# Patient Record
Sex: Female | Born: 1960
Health system: Southern US, Community
[De-identification: ages and names within clinical notes are randomized; demographics above are authoritative.]

## PROBLEM LIST (undated history)

## (undated) DIAGNOSIS — D649 Anemia, unspecified: Secondary | ICD-10-CM

## (undated) DIAGNOSIS — M1990 Unspecified osteoarthritis, unspecified site: Secondary | ICD-10-CM

## (undated) DIAGNOSIS — L0292 Furuncle, unspecified: Secondary | ICD-10-CM

## (undated) DIAGNOSIS — Z8619 Personal history of other infectious and parasitic diseases: Secondary | ICD-10-CM

## (undated) DIAGNOSIS — L0293 Carbuncle, unspecified: Secondary | ICD-10-CM

## (undated) DIAGNOSIS — I10 Essential (primary) hypertension: Secondary | ICD-10-CM

## (undated) HISTORY — PX: POLYPECTOMY: SHX149

## (undated) HISTORY — DX: Personal history of other infectious and parasitic diseases: Z86.19

## (undated) HISTORY — DX: Furuncle, unspecified: L02.92

## (undated) HISTORY — DX: Essential (primary) hypertension: I10

## (undated) HISTORY — PX: ABDOMINAL HYSTERECTOMY: SHX81

## (undated) HISTORY — DX: Unspecified osteoarthritis, unspecified site: M19.90

## (undated) HISTORY — DX: Anemia, unspecified: D64.9

## (undated) HISTORY — DX: Carbuncle, unspecified: L02.93

---

## 1998-03-23 ENCOUNTER — Other Ambulatory Visit: Admission: RE | Admit: 1998-03-23 | Discharge: 1998-03-23 | Payer: Self-pay | Admitting: Gynecology

## 2003-03-08 ENCOUNTER — Other Ambulatory Visit: Admission: RE | Admit: 2003-03-08 | Discharge: 2003-03-08 | Payer: Self-pay | Admitting: Obstetrics and Gynecology

## 2004-12-05 ENCOUNTER — Encounter: Admission: RE | Admit: 2004-12-05 | Discharge: 2004-12-05 | Payer: Self-pay | Admitting: Occupational Medicine

## 2008-11-05 ENCOUNTER — Emergency Department (HOSPITAL_COMMUNITY): Admission: EM | Admit: 2008-11-05 | Discharge: 2008-11-05 | Payer: Self-pay | Admitting: Emergency Medicine

## 2009-05-05 ENCOUNTER — Ambulatory Visit: Payer: Self-pay | Admitting: Internal Medicine

## 2009-05-05 DIAGNOSIS — L0293 Carbuncle, unspecified: Secondary | ICD-10-CM

## 2009-05-05 DIAGNOSIS — D509 Iron deficiency anemia, unspecified: Secondary | ICD-10-CM | POA: Insufficient documentation

## 2009-05-05 DIAGNOSIS — E569 Vitamin deficiency, unspecified: Secondary | ICD-10-CM | POA: Insufficient documentation

## 2009-05-05 DIAGNOSIS — L0292 Furuncle, unspecified: Secondary | ICD-10-CM | POA: Insufficient documentation

## 2009-06-14 ENCOUNTER — Ambulatory Visit: Payer: Self-pay | Admitting: Internal Medicine

## 2009-06-15 ENCOUNTER — Encounter: Payer: Self-pay | Admitting: Internal Medicine

## 2009-06-15 LAB — HM MAMMOGRAPHY: HM Mammogram: NORMAL

## 2010-03-02 ENCOUNTER — Ambulatory Visit: Payer: Self-pay | Admitting: Internal Medicine

## 2010-04-23 ENCOUNTER — Ambulatory Visit: Payer: Self-pay | Admitting: Internal Medicine

## 2010-04-23 LAB — CONVERTED CEMR LAB
ALT: 14 units/L (ref 0–35)
AST: 21 units/L (ref 0–37)
Albumin: 4 g/dL (ref 3.5–5.2)
BUN: 12 mg/dL (ref 6–23)
Basophils Absolute: 0 10*3/uL (ref 0.0–0.1)
Basophils Relative: 0.5 % (ref 0.0–3.0)
Bilirubin Urine: NEGATIVE
Bilirubin, Direct: 0.1 mg/dL (ref 0.0–0.3)
CO2: 30 meq/L (ref 19–32)
Calcium: 9.3 mg/dL (ref 8.4–10.5)
Cholesterol: 173 mg/dL (ref 0–200)
Creatinine, Ser: 0.9 mg/dL (ref 0.4–1.2)
Eosinophils Absolute: 0.1 10*3/uL (ref 0.0–0.7)
Eosinophils Relative: 1.3 % (ref 0.0–5.0)
Glucose, Bld: 86 mg/dL (ref 70–99)
HCT: 39.8 % (ref 36.0–46.0)
HDL: 76.9 mg/dL (ref >39.00)
Hemoglobin: 13.6 g/dL (ref 12.0–15.0)
Ketones, ur: NEGATIVE mg/dL
LDL Cholesterol: 86 mg/dL (ref 0–99)
Leukocytes, UA: NEGATIVE
Lymphocytes Relative: 45.5 % (ref 12.0–46.0)
Lymphs Abs: 3.3 10*3/uL (ref 0.7–4.0)
MCHC: 34.2 g/dL (ref 30.0–36.0)
MCV: 90 fL (ref 78.0–100.0)
Monocytes Absolute: 0.6 10*3/uL (ref 0.1–1.0)
Monocytes Relative: 8.5 % (ref 3.0–12.0)
Neutro Abs: 3.2 10*3/uL (ref 1.4–7.7)
Neutrophils Relative %: 44.2 % (ref 43.0–77.0)
Platelets: 206 10*3/uL (ref 150.0–400.0)
Potassium: 4.2 meq/L (ref 3.5–5.1)
RBC: 4.43 M/uL (ref 3.87–5.11)
RDW: 15 % — ABNORMAL HIGH (ref 11.5–14.6)
Specific Gravity, Urine: >=1.03 (ref 1.000–1.030)
TSH: 4.7 microintl units/mL (ref 0.35–5.50)
Total Bilirubin: 0.4 mg/dL (ref 0.3–1.2)
Total CHOL/HDL Ratio: 2
Total Protein, Urine: NEGATIVE mg/dL
Total Protein: 6.9 g/dL (ref 6.0–8.3)
Triglycerides: 52 mg/dL (ref 0.0–149.0)
Urine Glucose: NEGATIVE mg/dL
Urobilinogen, UA: 0.2 (ref 0.0–1.0)
VLDL: 10.4 mg/dL (ref 0.0–40.0)
WBC: 7.2 10*3/uL (ref 4.5–10.5)

## 2010-04-26 ENCOUNTER — Ambulatory Visit: Payer: Self-pay | Admitting: Internal Medicine

## 2010-04-26 DIAGNOSIS — R42 Dizziness and giddiness: Secondary | ICD-10-CM | POA: Insufficient documentation

## 2010-04-26 DIAGNOSIS — M79609 Pain in unspecified limb: Secondary | ICD-10-CM | POA: Insufficient documentation

## 2010-05-11 ENCOUNTER — Encounter: Payer: Self-pay | Admitting: Internal Medicine

## 2010-05-15 ENCOUNTER — Ambulatory Visit: Payer: Self-pay | Admitting: Cardiology

## 2010-05-15 ENCOUNTER — Encounter: Payer: Self-pay | Admitting: Internal Medicine

## 2010-05-15 ENCOUNTER — Ambulatory Visit: Payer: Self-pay

## 2010-05-15 ENCOUNTER — Ambulatory Visit (HOSPITAL_COMMUNITY): Admission: RE | Admit: 2010-05-15 | Discharge: 2010-05-15 | Payer: Self-pay | Admitting: Internal Medicine

## 2011-01-13 LAB — CONVERTED CEMR LAB
ALT: 11 units/L (ref 0–35)
AST: 22 units/L (ref 0–37)
Albumin: 4.3 g/dL (ref 3.5–5.2)
Alkaline Phosphatase: 73 units/L (ref 39–117)
BUN: 9 mg/dL (ref 6–23)
Basophils Absolute: 0.1 10*3/uL (ref 0.0–0.1)
Basophils Relative: 1.8 % (ref 0.0–3.0)
Bilirubin Urine: NEGATIVE
Bilirubin, Direct: 0.2 mg/dL (ref 0.0–0.3)
CO2: 31 meq/L (ref 19–32)
Calcium: 9.5 mg/dL (ref 8.4–10.5)
Chloride: 107 meq/L (ref 96–112)
Cholesterol: 183 mg/dL (ref 0–200)
Creatinine, Ser: 0.8 mg/dL (ref 0.4–1.2)
Eosinophils Absolute: 0.1 10*3/uL (ref 0.0–0.7)
Eosinophils Relative: 1.5 % (ref 0.0–5.0)
GFR calc non Af Amer: 98.53 mL/min (ref >60)
Glucose, Bld: 94 mg/dL (ref 70–99)
HCT: 36.2 % (ref 36.0–46.0)
HDL: 76.9 mg/dL (ref >39.00)
Hemoglobin, Urine: NEGATIVE
Hemoglobin: 12.4 g/dL (ref 12.0–15.0)
Iron: 55 ug/dL (ref 42–145)
Ketones, ur: NEGATIVE mg/dL
LDL Cholesterol: 91 mg/dL (ref 0–99)
Leukocytes, UA: NEGATIVE
Lymphocytes Relative: 48.6 % — ABNORMAL HIGH (ref 12.0–46.0)
Lymphs Abs: 4 10*3/uL (ref 0.7–4.0)
MCHC: 34.3 g/dL (ref 30.0–36.0)
MCV: 87.5 fL (ref 78.0–100.0)
Monocytes Absolute: 0.5 10*3/uL (ref 0.1–1.0)
Monocytes Relative: 5.8 % (ref 3.0–12.0)
Neutro Abs: 3.4 10*3/uL (ref 1.4–7.7)
Neutrophils Relative %: 42.3 % — ABNORMAL LOW (ref 43.0–77.0)
Nitrite: NEGATIVE
Pap Smear: NORMAL
Pap Smear: NORMAL
Platelets: 179 10*3/uL (ref 150.0–400.0)
Potassium: 4.4 meq/L (ref 3.5–5.1)
RBC: 4.14 M/uL (ref 3.87–5.11)
RDW: 13.1 % (ref 11.5–14.6)
Saturation Ratios: 13.9 % — ABNORMAL LOW (ref 20.0–50.0)
Sodium: 145 meq/L (ref 135–145)
Specific Gravity, Urine: <=1.005 (ref 1.000–1.030)
TSH: 2.57 microintl units/mL (ref 0.35–5.50)
Total Bilirubin: 0.6 mg/dL (ref 0.3–1.2)
Total CHOL/HDL Ratio: 2
Total Protein, Urine: NEGATIVE mg/dL
Total Protein: 7.1 g/dL (ref 6.0–8.3)
Transferrin: 282.3 mg/dL (ref 212.0–360.0)
Triglycerides: 74 mg/dL (ref 0.0–149.0)
Urine Glucose: NEGATIVE mg/dL
Urobilinogen, UA: 0.2 (ref 0.0–1.0)
VLDL: 14.8 mg/dL (ref 0.0–40.0)
WBC: 8.1 10*3/uL (ref 4.5–10.5)
pH: 6 (ref 5.0–8.0)

## 2011-01-17 NOTE — Miscellaneous (Signed)
Summary: Doctor, [facility]   Imported By: Lester [place] 03/09/2010 10:10:20  _____________________________________________________________________  External Attachment:    Type:   Image     Comment:   External Document

## 2011-01-17 NOTE — Miscellaneous (Signed)
Summary: Orders Update  Clinical Lists Changes  Orders: Added new Test order of Carotid Duplex (Carotid Duplex) - Signed 

## 2011-01-17 NOTE — Assessment & Plan Note (Signed)
Summary: SORE THROAT/FEVER/SIDE DOOR/CD   Vital Signs:  Patient profile:   50 year old female Height:      67 inches Weight:      138.50 pounds BMI:     21.77 O2 Sat:      99 % on Room air Temp:     99.1 degrees F oral Pulse rate:   82 / minute BP sitting:   110 / 80  (left arm) Cuff size:   regular  Vitals Entered ByZella Ball Ewing (March 02, 2010 9:16 AM)  O2 Flow:  Room air CC: sore throat, chest congestion/RE   CC:  sore throat and chest congestion/RE.  History of Present Illness: here with 2 to 3 days acute onset severe ST, and fever, general weakness and malaise and mild headache,;  Pt denies CP, sob, doe, wheezing, orthopnea, pnd, worsening LE edema, palps, dizziness or syncope    Problems Prior to Update: 1)  Pharyngitis-acute  (ICD-462) 2)  Boils, Recurrent  (ICD-680.9) 3)  Preventive Health Care  (ICD-V70.0) 4)  Unspecified Vitamin Deficiency  (ICD-269.2) 5)  Anemia-iron Deficiency  (ICD-280.9)  Medications Prior to Update: 1)  Biotin 1000 Mcg Tabs (Biotin) .Marland Kitchen.. 1 By Mouth Once Daily 2)  Vitamin C 500 Mg Tabs (Ascorbic Acid) .Marland Kitchen.. 1 By Mouth Once Daily 3)  Septra Ds 800-160 Mg Tabs (Sulfamethoxazole-Trimethoprim) .Marland Kitchen.. 1po Two Times A Day 4)  Hydrocodone-Acetaminophen 5-325 Mg Tabs (Hydrocodone-Acetaminophen) .Marland Kitchen.. 1po Q 6 Hrs As Needed  Current Medications (verified): 1)  Biotin 1000 Mcg Tabs (Biotin) .Marland Kitchen.. 1 By Mouth Once Daily 2)  Vitamin C 500 Mg Tabs (Ascorbic Acid) .Marland Kitchen.. 1 By Mouth Once Daily 3)  Azithromycin 250 Mg Tabs (Azithromycin) .... 2po Qd For 1 Day, Then 1po Qd For 4days, Then Stop 4)  Tessalon Perles 100 Mg Caps (Benzonatate) .Marland Kitchen.. 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  , mild ill  Head:  normocephalic and atraumatic.   Eyes:  vision grossly intact, pupils equal, and pupils round.   Ears:  bilat tm's red, sinus nontender Nose:  nasal dischargemucosal pallor and mucosal edema.   Mouth:  pharyngeal erythema, fair dentition, and pharyngeal exudate.   Neck:  supple and cervical lymphadenopathy.   Lungs:  normal respiratory effort and normal breath sounds.   Heart:  normal rate and regular rhythm.   Extremities:  no edema, no erythema    Impression & Recommendations:  Problem # 1:  PHARYNGITIS-ACUTE (ICD-462)  Her updated medication list for this problem includes:    Azithromycin 250 Mg Tabs (Azithromycin) .Marland Kitchen... 2po qd for 1 day, then 1po qd for 4days, then stop treat as above, f/u any worsening signs or symptoms   Complete Medication List: 1)  Biotin 1000 Mcg Tabs (Biotin) .Marland Kitchen.. 1 by mouth once daily 2)  Vitamin C 500 Mg Tabs (Ascorbic acid) .Marland Kitchen.. 1 by mouth once daily 3)  Azithromycin 250 Mg Tabs (Azithromycin) .Marland KitchenMarland KitchenMarland Kitchen  2po qd for 1 day, then 1po qd for 4days, then stop 4)  Tessalon Perles 100 Mg Caps (Benzonatate) .Marland Kitchen.. 1-2 by mouth three times a day as needed cough if needed  Patient Instructions: 1)  Please take all new medications as prescribed 2)  Continue all previous medications as before this visit  3)  You are given the work note today 4)  Please schedule a follow-up appointment in 2 months with CPX labs Prescriptions: TESSALON PERLES 100 MG CAPS (BENZONATATE) 1-2 by mouth three times a day as needed cough if needed  #60 x 1   Entered and Authorized by:   Corwin Levins MD   Signed by:   Corwin Levins MD on 03/02/2010   Method  used:   Print then Give to Patient   RxID:   [id number] AZITHROMYCIN 250 MG TABS (AZITHROMYCIN) 2po qd for 1 day, then 1po qd for 4days, then stop  #6 x 1   Entered and Authorized by:   Corwin Levins MD   Signed by:   Corwin Levins MD on 03/02/2010   Method used:   Print then Give to Patient   RxID:   [id number]

## 2011-01-17 NOTE — Assessment & Plan Note (Signed)
Summary: CPX / NWS #   Vital Signs:  Patient profile:   50 year old female Height:      67 inches Weight:      140.01 pounds BMI:     22.01 Temp:     97.8 degrees F oral Pulse rate:   72 / minute Pulse rhythm:   regular Resp:     16 per minute BP sitting:   130 / 80  (left arm) Cuff size:   regular  Vitals Entered By: Mervin Kung CMA (Apr 26, 2010 1:18 PM)  Preventive Care Screening  Mammogram:    Date:  06/15/2009    Results:  normal   Pap Smear:    Date:  06/15/2009    Results:  normal   CC: Room B1  Pt here for physical. States she is having burning and tingling from her left knee down x 1 month. Is Patient Diabetic? No Comments Pt states she has completed z-pack and tessalon perles.     CC:  Room B1  Pt here for physical. States she is having burning and tingling from her left knee down x 1 month..  History of Present Illness: overall doing well, except for the above, with burning tingling sensation intermitent, mild to area between the knee and ankle for 1 month, with some ? dysethesia to touch, but no leg weakness or numbness. Same discomfort happened last year seemed to resolve in a few days with ibuprofen.   Pt denies CP, sob, doe, wheezing, orthopnea, pnd, worsening LE edema, palps,  or syncope  Pt denies other new neuro symptoms such as headache, facial or extremity weakness No lower back pain.  No bowel or bladder changes.    Preventive Screening-Counseling & Management  Alcohol-Tobacco     Alcohol drinks/day: 3 glasses weekly     Alcohol type: wine     Smoking Status: current     Packs/Day: 0.25  Caffeine-Diet-Exercise     Caffeine use/day: 4 cups daily     Does Patient Exercise: yes     Type of exercise: walking--deliver mail      Drug Use:  no.    Problems Prior to Update: 1)  Leg Pain, Left  (ICD-729.5) 2)  Dizziness  (ICD-780.4) 3)  Boils, Recurrent  (ICD-680.9) 4)  Preventive Health Care  (ICD-V70.0) 5)  Unspecified Vitamin Deficiency   (ICD-269.2) 6)  Anemia-iron Deficiency  (ICD-280.9)  Medications Prior to Update: 1)  Biotin 1000 Mcg Tabs (Biotin) .Marland Kitchen.. 1 By Mouth Once Daily 2)  Vitamin C 500 Mg Tabs (Ascorbic Acid) .Marland Kitchen.. 1 By Mouth Once Daily 3)  Azithromycin 250 Mg Tabs (Azithromycin) .... 2po Qd For 1 Day, Then 1po Qd For 4days, Then Stop 4)  Tessalon Perles 100 Mg Caps (Benzonatate) .Marland Kitchen.. 1-2 By Mouth Three Times A Day As Needed Cough If Needed  Current Medications (verified): 1)  Biotin 1000 Mcg Tabs (Biotin) .Marland Kitchen.. 1 By Mouth Once Daily 2)  Vitamin C 500 Mg Tabs (Ascorbic Acid) .Marland Kitchen.. 1 By Mouth Once Daily 3)  Ibuprofen 400 Mg Tabs (Ibuprofen) .Marland Kitchen.. 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 Alcohol use-yes Drug use-no Packs/Day:  0.25 Caffeine use/day:  4 cups daily Does Patient Exercise:  yes Drug Use:  no  Review of Systems  The patient denies anorexia, fever, weight loss, weight gain, vision loss,  decreased hearing, hoarseness, chest pain, syncope, dyspnea on exertion, peripheral edema, prolonged cough, headaches, hemoptysis, abdominal pain, melena, hematochezia, severe indigestion/heartburn, hematuria, muscle weakness, suspicious skin lesions, transient blindness, difficulty walking, depression, unusual weight change, abnormal bleeding, enlarged lymph nodes, angioedema, and breast masses.         all otherwise negative per pt -  except  also this past sunday has significant dizziness with lightheadness; had to lie down for about an hour and improved;  , no frank syncope, lasted total about 3-4 hours and resolved  Physical Exam  General:  alert and well-developed.   Head:  normocephalic and atraumatic.   Eyes:  vision grossly intact, pupils equal, and pupils round.   Ears:  R ear normal and L ear normal.   Nose:  no external deformity and no nasal discharge.   Mouth:  no gingival abnormalities and pharynx pink and moist.   Neck:  supple and no masses.   Lungs:  normal respiratory effort and normal breath sounds.   Heart:  normal rate and regular rhythm.   Abdomen:  soft, non-tender, and normal bowel sounds.   Msk:  no joint tenderness and no joint swelling.   Extremities:  no edema, no erythema  Neurologic:  cranial nerves II-XII intact and strength normal in all extremities.   Skin:  color normal and no rashes.   Psych:  not anxious appearing and not depressed appearing.     Impression & Recommendations:  Problem # 1:  Preventive Health Care (ICD-V70.0)  Overall doing well, age appropriate education and counseling updated and referral for appropriate preventive services done unless declined, immunizations up to date or declined, diet counseling done if overweight, urged to quit smoking if smokes , most recent labs reviewed and current ordered if appropriate, ecg reviewed or declined (interpretation per ECG scanned in the EMR if done); information regarding Medicare Prevention  requirements given if appropriate; speciality referrals updated as appropriate   Orders: EKG w/ Interpretation (93000)  Problem # 2:  DIZZINESS (ICD-780.4)  unclear etiolgoy; will check echo and doppelrs as she is chronic smoker  Orders: Echo Referral (Echo) Misc. Referral (Misc. Ref)  Problem # 3:  LEG PAIN, LEFT (ICD-729.5) ? neuritic vs other soft tissue  - for ibuprofen as needed , f/u any worsening symtpoms, decliens b12 check today  Complete Medication List: 1)  Biotin 1000 Mcg Tabs (Biotin) .Marland Kitchen.. 1 by mouth once daily 2)  Vitamin C 500 Mg Tabs (Ascorbic acid) .Marland Kitchen.. 1 by mouth once daily 3)  Ibuprofen 400 Mg Tabs (Ibuprofen) .Marland Kitchen.. 1-2 by mouth q 6 hrs as needed pain  Patient Instructions: 1)  You will be contacted about the referral(s) to: echocardiogram and carotid doppler tests to further evaluate the dizziness 2)  Please take all new medications as prescribed 3)  Continue all previous medications as before this visit  4)  Please schedule a follow-up appointment in 1 year or sooner if needed Prescriptions: IBUPROFEN 400 MG TABS (IBUPROFEN) 1-2 by mouth q 6 hrs as needed pain  #60 x 2   Entered and Authorized by:   Corwin Levins MD   Signed by:   Corwin Levins MD on 04/26/2010   Method used:   Print then Give to Patient   RxID:   [id number]   Current Allergies (reviewed today): No known allergies

## 2011-01-17 NOTE — Letter (Signed)
Summary: Out of Work  [facility]  [address] [place], [place] [address]   Phone: [phone]  Fax: [phone]    March 02, 2010   Employee:  Danielle Williams    To Whom It May Concern:   For Medical reasons, please excuse the above named employee from work for the following dates:  Start:   Mar 02, 2010  End:   Mar 04, 2010    -    to return to work Mar 05, 2010 without restriction  If you need additional information, please feel free to contact our office.         Sincerely,    Corwin Levins MD

## 2011-03-19 ENCOUNTER — Ambulatory Visit: Payer: Self-pay | Admitting: Internal Medicine

## 2011-06-27 ENCOUNTER — Encounter: Payer: Self-pay | Admitting: Internal Medicine

## 2011-06-27 DIAGNOSIS — Z0000 Encounter for general adult medical examination without abnormal findings: Secondary | ICD-10-CM | POA: Insufficient documentation

## 2011-06-28 ENCOUNTER — Emergency Department (HOSPITAL_COMMUNITY): Payer: Federal, State, Local not specified - PPO

## 2011-06-28 ENCOUNTER — Encounter: Payer: Self-pay | Admitting: Internal Medicine

## 2011-06-28 ENCOUNTER — Emergency Department (HOSPITAL_COMMUNITY)
Admission: EM | Admit: 2011-06-28 | Discharge: 2011-06-28 | Disposition: A | Discharge status: Home / Self Care | Payer: Federal, State, Local not specified - PPO | Attending: Emergency Medicine | Admitting: Emergency Medicine

## 2011-06-28 ENCOUNTER — Ambulatory Visit (INDEPENDENT_AMBULATORY_CARE_PROVIDER_SITE_OTHER): Payer: Federal, State, Local not specified - PPO | Admitting: Internal Medicine

## 2011-06-28 VITALS — BP 118/88 | HR 72 | Temp 97.9°F | Resp 14 | Wt 133.2 lb

## 2011-06-28 DIAGNOSIS — I6529 Occlusion and stenosis of unspecified carotid artery: Secondary | ICD-10-CM | POA: Insufficient documentation

## 2011-06-28 DIAGNOSIS — L0292 Furuncle, unspecified: Secondary | ICD-10-CM

## 2011-06-28 DIAGNOSIS — R079 Chest pain, unspecified: Secondary | ICD-10-CM | POA: Insufficient documentation

## 2011-06-28 DIAGNOSIS — R0789 Other chest pain: Secondary | ICD-10-CM

## 2011-06-28 DIAGNOSIS — I200 Unstable angina: Secondary | ICD-10-CM | POA: Insufficient documentation

## 2011-06-28 DIAGNOSIS — L0293 Carbuncle, unspecified: Secondary | ICD-10-CM

## 2011-06-28 LAB — COMPREHENSIVE METABOLIC PANEL
ALT: 10 U/L (ref 0–35)
AST: 20 U/L (ref 0–37)
Albumin: 3.7 g/dL (ref 3.5–5.2)
Alkaline Phosphatase: 71 U/L (ref 39–117)
CO2: 27 mEq/L (ref 19–32)
Calcium: 9.3 mg/dL (ref 8.4–10.5)
Chloride: 105 mEq/L (ref 96–112)
Creatinine, Ser: 0.71 mg/dL (ref 0.50–1.10)
GFR calc Af Amer: >60 mL/min (ref >60)
GFR calc non Af Amer: >60 mL/min (ref >60)
Potassium: 3.6 mEq/L (ref 3.5–5.1)
Total Bilirubin: 0.2 mg/dL — ABNORMAL LOW (ref 0.3–1.2)
Total Protein: 6.8 g/dL (ref 6.0–8.3)

## 2011-06-28 LAB — DIFFERENTIAL
Basophils Absolute: 0 10*3/uL (ref 0.0–0.1)
Basophils Relative: 0 % (ref 0–1)
Eosinophils Absolute: 0.1 10*3/uL (ref 0.0–0.7)
Eosinophils Relative: 1 % (ref 0–5)
Lymphocytes Relative: 49 % — ABNORMAL HIGH (ref 12–46)
Lymphs Abs: 3.4 10*3/uL (ref 0.7–4.0)
Monocytes Absolute: 0.3 10*3/uL (ref 0.1–1.0)
Monocytes Relative: 5 % (ref 3–12)
Neutrophils Relative %: 45 % (ref 43–77)

## 2011-06-28 LAB — CBC
HCT: 34.8 % — ABNORMAL LOW (ref 36.0–46.0)
Hemoglobin: 12.1 g/dL (ref 12.0–15.0)
MCH: 29.8 pg (ref 26.0–34.0)
MCHC: 34.8 g/dL (ref 30.0–36.0)
MCV: 85.7 fL (ref 78.0–100.0)
Platelets: 211 10*3/uL (ref 150–400)
RBC: 4.06 MIL/uL (ref 3.87–5.11)
RDW: 13.8 % (ref 11.5–15.5)
WBC: 6.9 10*3/uL (ref 4.0–10.5)

## 2011-06-28 LAB — CK TOTAL AND CKMB (NOT AT ARMC)
CK, MB: 3 ng/mL (ref 0.3–4.0)
Relative Index: 1.2 (ref 0.0–2.5)

## 2011-06-28 LAB — TROPONIN I: Troponin I: <0.3 ng/mL (ref <0.30)

## 2011-06-28 NOTE — Patient Instructions (Signed)
You were given the ASA 81 mg in the office today Continue all other medications as before Please go with EMS to the [facility] ER for further eval and treatment

## 2011-06-28 NOTE — Assessment & Plan Note (Signed)
Currently on doxy course

## 2011-06-28 NOTE — Assessment & Plan Note (Signed)
New for 3 days, will give ASA 81 po x 1 now, call for EMS to transfer to [facility] ER for further eval and tx

## 2011-06-28 NOTE — Assessment & Plan Note (Signed)
Will need f/u doppler when able

## 2011-06-28 NOTE — Progress Notes (Signed)
  Subjective:    Patient ID: Danielle Williams, female    DOB: 1961-01-01, 50 y.o.   MRN: [id number]  HPI  50 yo F, chronic smoker, known carotid atherosclerotic dz, here with 3 days onset intermittent mid and left mid sternal area chest pain, dull with some tingling to left arm, worse 2 days ago, now less so, but having active discomfort now - mild;  nonpleuritic and nonexertional; has intermittent sweats which have not been worse, and not assoc with worsening sob, n/v, other diaphoresis, palp or syncope.   ECG today - sinus , with old IMI  - new from prior ECG  May 2011.   Did not think to try tums but doesn't think it reflux. May 2011 Echo - normal EF, no wall motion abnormalities Past Medical History  Diagnosis Date  . Anemia     Iron def  . History of hepatitis   . Recurrent boils     Recurrent  . Vitamin D deficiency    Past Surgical History  Procedure Date  . Abdominal hysterectomy     Due to fibroids    reports that she has been smoking.  She does not have any smokeless tobacco history on file. She reports that she drinks alcohol. She reports that she uses illicit drugs. family history includes Alcohol abuse in her cousin; Arthritis in her other; Cancer in her cousin and other; Diabetes in her sister; Heart disease in her other; and Hypertension in her sister. No Known Allergies Current Outpatient Prescriptions on File Prior to Visit  Medication Sig Dispense Refill  . Biotin 1000 MCG tablet Take 1,000 mcg by mouth daily.        Marland Kitchen ibuprofen (ADVIL,MOTRIN) 400 MG tablet Take 400 mg by mouth every 6 (six) hours as needed.        . vitamin C (ASCORBIC ACID) 500 MG tablet Take 500 mg by mouth daily.         Review of Systems Review of Systems  Constitutional: Negative for diaphoresis and unexpected weight change.  HENT: Negative for drooling and tinnitus.   Eyes: Negative for photophobia and visual disturbance.  Respiratory: Negative for choking and stridor.   Gastrointestinal:  Negative for vomiting and blood in stool.  Genitourinary: Negative for hematuria and decreased urine volume.  Musculoskeletal: Negative for gait problem.  Skin: Negative for color change and wound.  Neurological: Negative for tremors and numbness.  Psychiatric/Behavioral: Negative for decreased concentration. The patient is not hyperactive.       Objective:   Physical Exam BP 118/88  Pulse 72  Temp(Src) 97.9 F (36.6 C) (Oral)  Resp 14  Wt 133 lb 4 oz (60.442 kg)  SpO2 90% Physical Exam  VS noted Constitutional: Pt appears well-developed and well-nourished.  HENT: Head: Normocephalic.  Right Ear: External ear normal.  Left Ear: External ear normal.  Eyes: Conjunctivae and EOM are normal. Pupils are equal, round, and reactive to light.  Neck: Normal range of motion. Neck supple.  Cardiovascular: Normal rate and regular rhythm.   Pulmonary/Chest: Effort normal and breath sounds normal.  Abd:  Soft, NT, non-distended, + BS Neurological: Pt is alert. No cranial nerve deficit.  Skin: Skin is warm. No erythema.  Psychiatric: Pt behavior is normal. Thought content normal.         Assessment & Plan:

## 2011-07-04 ENCOUNTER — Encounter: Payer: Self-pay | Admitting: Cardiology

## 2011-07-04 ENCOUNTER — Ambulatory Visit (INDEPENDENT_AMBULATORY_CARE_PROVIDER_SITE_OTHER): Payer: Federal, State, Local not specified - PPO | Admitting: Cardiology

## 2011-07-04 ENCOUNTER — Encounter: Payer: Self-pay | Admitting: Internal Medicine

## 2011-07-04 ENCOUNTER — Ambulatory Visit (INDEPENDENT_AMBULATORY_CARE_PROVIDER_SITE_OTHER): Payer: Federal, State, Local not specified - PPO | Admitting: Internal Medicine

## 2011-07-04 VITALS — BP 132/80 | HR 80 | Temp 98.1°F | Ht 67.0 in | Wt 135.5 lb

## 2011-07-04 DIAGNOSIS — L0293 Carbuncle, unspecified: Secondary | ICD-10-CM

## 2011-07-04 DIAGNOSIS — R079 Chest pain, unspecified: Secondary | ICD-10-CM | POA: Insufficient documentation

## 2011-07-04 DIAGNOSIS — Z720 Tobacco use: Secondary | ICD-10-CM | POA: Insufficient documentation

## 2011-07-04 DIAGNOSIS — F17200 Nicotine dependence, unspecified, uncomplicated: Secondary | ICD-10-CM

## 2011-07-04 DIAGNOSIS — Z0000 Encounter for general adult medical examination without abnormal findings: Secondary | ICD-10-CM

## 2011-07-04 DIAGNOSIS — L0292 Furuncle, unspecified: Secondary | ICD-10-CM

## 2011-07-04 DIAGNOSIS — R9431 Abnormal electrocardiogram [ECG] [EKG]: Secondary | ICD-10-CM

## 2011-07-04 DIAGNOSIS — I6529 Occlusion and stenosis of unspecified carotid artery: Secondary | ICD-10-CM

## 2011-07-04 MED ORDER — VARENICLINE TARTRATE 1 MG PO TABS: 1.0000 mg | ORAL_TABLET | Freq: Two times a day (BID) | ORAL | Status: DC

## 2011-07-04 MED ORDER — DOXYCYCLINE HYCLATE 100 MG PO TABS: 100.0000 mg | ORAL_TABLET | Freq: Two times a day (BID) | ORAL | Status: AC

## 2011-07-04 NOTE — Progress Notes (Signed)
HPI The patient presents for evaluation of chest discomfort and an abnormal EKG. She does have a history of carotid stenosis as described below. She had an echocardiogram last year apparently to evaluate an abnormal EKG. I have reviewed this and there were no significant abnormalities. Last week she had some chest discomfort. This was left-sided. There was some discomfort and left arm. It lasted all Wednesday night. It was intermittent over the next couple of days. Followup EKG suggested new inferior Q waves though no acute ST T-wave changes. She subsequently presented to the emergency room where she ruled out for myocardial infarction. A followup EKG did not demonstrate Q waves and suggested that there was possibly a lead placement issue with the previous EKG. Over the past couple of days she has had no recurrent chest discomfort. She's had decreased exercise tolerance however. Her chest discomfort originally was moderate in intensity. There was no associated nausea or vomiting. She did have some mild shortness of breath no PND or orthopnea. 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  No Known Allergies  Current Outpatient Prescriptions  Medication Sig Dispense Refill  . Biotin 1000 MCG tablet Take 1,000 mcg by mouth daily.        Marland Kitchen doxycycline (VIBRA-TABS) 100 MG tablet Take 100 mg by mouth 2 (two) times daily.        Marland Kitchen ibuprofen (ADVIL,MOTRIN) 400 MG tablet Take 400 mg by mouth every 6 (six) hours as needed.        . vitamin C (ASCORBIC ACID) 500 MG tablet Take 500 mg by mouth daily.          Past Medical History  Diagnosis Date  . Anemia     Iron def  . History of hepatitis   . Recurrent boils     Recurrent  . Vitamin D deficiency     Past Surgical History  Procedure Date  . Abdominal hysterectomy     Due to fibroids    Family History  Problem Relation Age of Onset  . Hypertension Sister   . Diabetes Sister     . Arthritis Other     Parent  . Cancer Other     Breast and ovary-sister and cousin  . Heart disease Other     Aunt  . Cancer Cousin     Brain  . Alcohol abuse Cousin     Multiple    History   Social History  . Marital Status: Divorced    Spouse Name: N/A    Number of Children: N/A  . Years of Education: N/A   Occupational History  . Not on file.   Social History Main Topics  . Smoking status: Current Everyday Smoker -- 0.5 packs/day for 30 years    Types: Cigarettes  . Smokeless tobacco: Never Used   Comment: off and on for 38yrs  . Alcohol Use: Yes  . Drug Use: Yes  . Sexually Active: Not on file   Other Topics Concern  . Not on file   Social History Narrative  . No narrative on file    ROS: As stated in the HPI and negative for all other systems.   PHYSICAL EXAM BP 114/84  Pulse 83  Ht 5\' 7"  (1.702 m)  Wt 133 lb (60.328 kg)  BMI 20.83 kg/m2 GENERAL:  Well appearing HEENT:  Pupils equal round and reactive, fundi not visualized, oral mucosa unremarkable,  dentures NECK:  No jugular venous distention, waveform within normal limits, carotid upstroke brisk and symmetric, no bruits, no thyromegaly LYMPHATICS:  No cervical, inguinal adenopathy LUNGS:  Clear to auscultation bilaterally BACK:  No CVA tenderness CHEST:  Unremarkable HEART:  PMI not displaced or sustained,S1 and S2 within normal limits, no S3, no S4, no clicks, no rubs, no murmurs ABD:  Flat, positive bowel sounds normal in frequency in pitch, no bruits, no rebound, no guarding, no midline pulsatile mass, no hepatomegaly, no splenomegaly EXT:  2 plus pulses throughout, no edema, no cyanosis no clubbing SKIN:  No rashes no nodules NEURO:  Cranial nerves II through XII grossly intact, motor grossly intact throughout PSYCH:  Cognitively intact, oriented to person place and time   EKG:  Rhythm, rate 83, leftward axis,, RSR prime V1 and V2, left ventricular hypertrophy inferior T-wave inversion  nonspecific  ASSESSMENT AND PLAN

## 2011-07-04 NOTE — Patient Instructions (Signed)
Take all new medications as prescribed Continue all other medications as before Please keep your appointments with your specialists as you have planned - Dr Antoine Poche, and the tests ordered Please quit smoking, as you plan to do with the chantix You are given the work note today You are given the EKG and Echo results from May 2011 Please return in 6 mo with Lab testing done 3-5 days before

## 2011-07-04 NOTE — Assessment & Plan Note (Signed)
Mild to mod, for antibx course,  to f/u any worsening symptoms or concerns 

## 2011-07-04 NOTE — Assessment & Plan Note (Signed)
I spent greater than 3 minutes discussing the need to stop smoking. She doesn't want a prescription for Chantix. We discussed the black box warning at all to side effects.

## 2011-07-04 NOTE — Assessment & Plan Note (Signed)
Patient chest pain is somewhat atypical. However, she does have some vascular disease documented and risk factors. I will check an echocardiogram to make sure there is no new wall motion abnormalities. I will then plan an exercise treadmill test. She needs aggressive risk reduction.

## 2011-07-04 NOTE — Assessment & Plan Note (Signed)
She will have a followup carotid Doppler.

## 2011-07-04 NOTE — Patient Instructions (Addendum)
Your physician has requested that you have a carotid duplex. This test is an ultrasound of the carotid arteries in your neck. It looks at blood flow through these arteries that supply the brain with blood. Allow one hour for this exam. There are no restrictions or special instructions.  Your physician has requested that you have an echocardiogram. Echocardiography is a painless test that uses sound waves to create images of your heart. It provides your doctor with information about the size and shape of your heart and how well your heart's chambers and valves are working. This procedure takes approximately one hour. There are no restrictions for this procedure.  You are being scheduled for a treadmill.  Please follow the instructions given.  Your physician discussed the hazards of tobacco use. Tobacco use cessation is recommended and techniques and options to help you quit were discussed.  Please continue your current medications as listed.  You may start Chantix as ordered.  A prescription has been sent into your pharmacy.

## 2011-07-04 NOTE — Assessment & Plan Note (Signed)
To continue eval per cardiology, for work note today

## 2011-07-04 NOTE — Progress Notes (Signed)
  Subjective:    Patient ID: Danielle Williams, female    DOB: 09-03-1961, 50 y.o.   MRN: [id number]  HPI Here to f/u, was seen per cardiology with recommendation for carotid dopplers, echo and stress test; needs note for work for next few days;   Pt denies fever, wt loss, night sweats, loss of appetite, or other constitutional symptoms Pt denie, increased sob or doe, wheezing, orthopnea, PND, increased LE swelling, palpitations, dizziness or syncope. Pt denies new neurological symptoms such as new headache, or facial or extremity weakness or numbness   Pt denies polydipsia, polyuria.  Does have sense of ongoing fatigue, but denies signficant hypersomnolence. No other new complaints, except for recurrent small abscess ? May be starting again to right buttock, has hx of mult abscess in past Past Medical History  Diagnosis Date  . Anemia     Iron def  . History of hepatitis     Type A  . Recurrent boils     Recurrent  . Vitamin D deficiency    Past Surgical History  Procedure Date  . Abdominal hysterectomy     Due to fibroids    reports that she has been smoking Cigarettes.  She has a 15 pack-year smoking history. She has never used smokeless tobacco. She reports that she drinks alcohol. She reports that she uses illicit drugs. family history includes Alcohol abuse in her cousin; Arthritis in her other; Cancer in her cousin and other; Diabetes in her sister; Heart disease in her other; and Hypertension in her sister. No Known Allergies Current Outpatient Prescriptions on File Prior to Visit  Medication Sig Dispense Refill  . Biotin 1000 MCG tablet Take 1,000 mcg by mouth daily.        Marland Kitchen ibuprofen (ADVIL,MOTRIN) 400 MG tablet Take 400 mg by mouth every 6 (six) hours as needed.        . varenicline (CHANTIX CONTINUING MONTH PAK) 1 MG tablet Take 1 tablet (1 mg total) by mouth 2 (two) times daily.  60 tablet  3  . vitamin C (ASCORBIC ACID) 500 MG tablet Take 500 mg by mouth daily.         Review  of Systems Review of Systems  Constitutional: Negative for diaphoresis and unexpected weight change.  HENT: Negative for drooling and tinnitus.   Eyes: Negative for photophobia and visual disturbance.  Gastrointestinal: Negative for vomiting and blood in stool.     Objective:   Physical Exam BP 132/80  Pulse 80  Temp(Src) 98.1 F (36.7 C) (Oral)  Ht 5\' 7"  (1.702 m)  Wt 135 lb 8 oz (61.462 kg)  BMI 21.22 kg/m2  SpO2 98% Physical Exam  VS noted Constitutional: Pt appears well-developed and well-nourished.  HENT: Head: Normocephalic.  Right Ear: External ear normal.  Left Ear: External ear normal.  Eyes: Conjunctivae and EOM are normal. Pupils are equal, round, and reactive to light.  Neck: Normal range of motion. Neck supple.  Cardiovascular: Normal rate and regular rhythm.   Pulmonary/Chest: Effort normal and breath sounds normal.  Neurological: Pt is alert. No cranial nerve deficit.  Skin: Skin is warm. No erythema.  Psychiatric: Pt behavior is normal. Thought content normal.         Assessment & Plan:

## 2011-07-30 ENCOUNTER — Ambulatory Visit (INDEPENDENT_AMBULATORY_CARE_PROVIDER_SITE_OTHER): Payer: Federal, State, Local not specified - PPO | Admitting: Physician Assistant

## 2011-07-30 ENCOUNTER — Encounter (INDEPENDENT_AMBULATORY_CARE_PROVIDER_SITE_OTHER): Payer: Federal, State, Local not specified - PPO | Admitting: *Deleted

## 2011-07-30 ENCOUNTER — Ambulatory Visit (HOSPITAL_COMMUNITY): Payer: Federal, State, Local not specified - PPO | Attending: Cardiology | Admitting: Radiology

## 2011-07-30 DIAGNOSIS — R079 Chest pain, unspecified: Secondary | ICD-10-CM | POA: Insufficient documentation

## 2011-07-30 DIAGNOSIS — I6529 Occlusion and stenosis of unspecified carotid artery: Secondary | ICD-10-CM

## 2011-07-30 DIAGNOSIS — R072 Precordial pain: Secondary | ICD-10-CM

## 2011-07-30 DIAGNOSIS — R9431 Abnormal electrocardiogram [ECG] [EKG]: Secondary | ICD-10-CM

## 2011-07-30 DIAGNOSIS — F17200 Nicotine dependence, unspecified, uncomplicated: Secondary | ICD-10-CM | POA: Insufficient documentation

## 2011-07-30 DIAGNOSIS — R42 Dizziness and giddiness: Secondary | ICD-10-CM | POA: Insufficient documentation

## 2011-07-30 NOTE — Progress Notes (Signed)
Exercise Treadmill Test  Pre-Exercise Testing Evaluation Rhythm: sinus bradycardia  Rate: 58   PR:  .18 QRS:  .09  QT:  .44 QTc: .43     Test  Exercise Tolerance Test Unique Test No: 1  Treadmill:  1  Indication for ETT: chest pain - rule out ischemia  Contraindication to ETT: No   Stress Modality: exercise - treadmill  Cardiac Imaging Performed: non   Protocol: standard Bruce - maximal  Max BP:  196/102  Max MPHR (bpm):170 85% MPR (bpm):144  MPHR obtained (bpm):  171 % MPHR obtained:  101  Reached 85% MPHR (min:sec):  5:52 Total Exercise Time (min-sec):  8:00  Workload in METS:  13.4 Borg Scale: 15  Reason ETT Terminated:  desired heart rate attained    ST Segment Analysis At Rest: Non Specific ST changes With Exercise: no evidence of significant ST depression  Other Information Arrhythmia:  No Angina during ETT:  absent (0) Quality of ETT:  diagnostic  ETT Interpretation:  normal - no evidence of ischemia by ST analysis  Comments: Good exercise tolerance. Hypertensive BP response. In recovery, patient notes chest pain started in last stage.  Improved in recovery. No ST-T changes to suggest ischemia.  Recommendations: Recommend she have BP checked with nurse in next 1-2 weeks to follow up. Follow up with Dr. Antoine Poche as directed.

## 2011-07-30 NOTE — Patient Instructions (Signed)
Your physician recommends that you schedule a follow-up appointment in: 1 WEEK NURSE VISIT FOR BLOOD PRESSURE CHECK

## 2011-07-31 ENCOUNTER — Encounter: Payer: Self-pay | Admitting: Cardiology

## 2011-08-06 ENCOUNTER — Encounter: Payer: Self-pay | Admitting: *Deleted

## 2011-08-06 ENCOUNTER — Ambulatory Visit (INDEPENDENT_AMBULATORY_CARE_PROVIDER_SITE_OTHER): Payer: Federal, State, Local not specified - PPO

## 2011-08-06 VITALS — BP 144/98 | HR 83 | Resp 18 | Ht 67.0 in | Wt 134.0 lb

## 2011-08-06 DIAGNOSIS — R079 Chest pain, unspecified: Secondary | ICD-10-CM

## 2011-08-06 NOTE — Progress Notes (Signed)
Patient in for B/P check. On 07/30/11 pt came in for stress test B/P was in the 170's systolic prior stress test. Today right arm B/P 130/103, left arm 144/98 pulse 83 beats/ minute. Patient C/O of left arm and left  Leg  Tingling and   light headed early today. Patient is not on any B/P medications. Patient aware that she will be called after Md review nurse visit,as per Avie Arenas RN.  Patient was d/C home in satisfactory condition.

## 2011-08-07 ENCOUNTER — Telehealth: Payer: Self-pay | Admitting: Cardiology

## 2011-08-07 ENCOUNTER — Telehealth: Payer: Self-pay | Admitting: *Deleted

## 2011-08-07 NOTE — Telephone Encounter (Signed)
Walk In Pt Form " Pt needs a call back about her Test done on 07/30/11,please call @ [phone]"  Sent to Pam/Hochrein  08/07/11/km

## 2011-08-07 NOTE — Telephone Encounter (Signed)
Attempted to call pt after receiving a walk in sheet from Medical records this afternoon.  There was no answer and I left a message that I will call back tomorrow or our phones are back on at 8am.  Pt is trying to follow up on her BP check from 8/21 which was 146/96.  She is not on any antihypertensives.

## 2011-08-08 NOTE — Telephone Encounter (Signed)
Returning call back to nurse.  

## 2011-08-12 ENCOUNTER — Telehealth: Payer: Self-pay | Admitting: Cardiology

## 2011-08-12 ENCOUNTER — Telehealth: Payer: Self-pay | Admitting: *Deleted

## 2011-08-12 DIAGNOSIS — I10 Essential (primary) hypertension: Secondary | ICD-10-CM

## 2011-08-12 MED ORDER — CHLORTHALIDONE 25 MG PO TABS: 25.0000 mg | ORAL_TABLET | Freq: Every day | ORAL | Status: DC

## 2011-08-12 NOTE — Telephone Encounter (Signed)
Addended by: Jacqlyn Krauss on: 08/12/2011 02:09 PM   Modules accepted: Orders

## 2011-08-12 NOTE — Telephone Encounter (Signed)
Pt calling stating she was here 8/21 for BP check and someone was to call her back--pam fleming called her back but pt was not home--pt is concerned about elevated BP and would like to talk to pam or dr hochrein about this--i see a f/u with dr Shela Commons Jonny Ruiz, but do not see f/u with dr hochrein--advised i would pass message along to pam and dr hochrein again--pt agrees--nt

## 2011-08-12 NOTE — Telephone Encounter (Signed)
I talked with pt. She will start chlorthalidone 25mg  daily. She will return for [facility] 08/16/11.

## 2011-08-12 NOTE — Telephone Encounter (Signed)
Changes are OK

## 2011-08-12 NOTE — Telephone Encounter (Signed)
Start chlorthalidone 25 mg daily, dispense number 90 with 3 refills.  She needs a BMET in 5 days.

## 2011-08-12 NOTE — Telephone Encounter (Signed)
Walk In Pt Form " Pt Needs call back about her test.." sent to message nurse  08/12/11/km

## 2011-08-16 ENCOUNTER — Other Ambulatory Visit: Payer: Self-pay | Admitting: *Deleted

## 2011-08-16 ENCOUNTER — Other Ambulatory Visit (INDEPENDENT_AMBULATORY_CARE_PROVIDER_SITE_OTHER): Payer: Federal, State, Local not specified - PPO | Admitting: *Deleted

## 2011-08-16 DIAGNOSIS — E876 Hypokalemia: Secondary | ICD-10-CM

## 2011-08-16 DIAGNOSIS — I10 Essential (primary) hypertension: Secondary | ICD-10-CM

## 2011-08-16 LAB — BASIC METABOLIC PANEL
BUN: 18 mg/dL (ref 6–23)
Calcium: 9.6 mg/dL (ref 8.4–10.5)
GFR: 69.78 mL/min (ref >60.00)
Glucose, Bld: 99 mg/dL (ref 70–99)

## 2011-08-16 MED ORDER — POTASSIUM CHLORIDE CRYS ER 20 MEQ PO TBCR: EXTENDED_RELEASE_TABLET | ORAL | Status: DC

## 2011-08-20 ENCOUNTER — Other Ambulatory Visit (INDEPENDENT_AMBULATORY_CARE_PROVIDER_SITE_OTHER): Payer: Federal, State, Local not specified - PPO | Admitting: *Deleted

## 2011-08-20 ENCOUNTER — Encounter: Payer: Self-pay | Admitting: Cardiology

## 2011-08-20 DIAGNOSIS — E876 Hypokalemia: Secondary | ICD-10-CM

## 2011-08-20 LAB — BASIC METABOLIC PANEL
CO2: 34 mEq/L — ABNORMAL HIGH (ref 19–32)
Calcium: 9.4 mg/dL (ref 8.4–10.5)
Creatinine, Ser: 1 mg/dL (ref 0.4–1.2)

## 2011-08-23 ENCOUNTER — Other Ambulatory Visit (INDEPENDENT_AMBULATORY_CARE_PROVIDER_SITE_OTHER): Payer: Federal, State, Local not specified - PPO | Admitting: *Deleted

## 2011-08-23 DIAGNOSIS — Z0000 Encounter for general adult medical examination without abnormal findings: Secondary | ICD-10-CM

## 2011-08-23 LAB — BASIC METABOLIC PANEL
BUN: 13 mg/dL (ref 6–23)
GFR: 81.02 mL/min (ref >60.00)
Potassium: 3 mEq/L — ABNORMAL LOW (ref 3.5–5.1)
Sodium: 136 mEq/L (ref 135–145)

## 2011-08-26 ENCOUNTER — Telehealth: Payer: Self-pay

## 2011-08-26 DIAGNOSIS — E876 Hypokalemia: Secondary | ICD-10-CM

## 2011-08-26 MED ORDER — POTASSIUM CHLORIDE CRYS ER 20 MEQ PO TBCR: 20.0000 meq | EXTENDED_RELEASE_TABLET | Freq: Two times a day (BID) | ORAL | Status: DC

## 2011-08-26 NOTE — Telephone Encounter (Signed)
A user error has taken place: encounter opened in error, closed for administrative reasons.

## 2011-08-29 ENCOUNTER — Other Ambulatory Visit: Payer: Self-pay | Admitting: *Deleted

## 2011-08-29 ENCOUNTER — Other Ambulatory Visit (INDEPENDENT_AMBULATORY_CARE_PROVIDER_SITE_OTHER): Payer: Federal, State, Local not specified - PPO | Admitting: *Deleted

## 2011-08-29 DIAGNOSIS — E876 Hypokalemia: Secondary | ICD-10-CM

## 2011-08-29 DIAGNOSIS — I5022 Chronic systolic (congestive) heart failure: Secondary | ICD-10-CM

## 2011-08-29 DIAGNOSIS — Z79899 Other long term (current) drug therapy: Secondary | ICD-10-CM

## 2011-08-29 LAB — BASIC METABOLIC PANEL
CO2: 31 mEq/L (ref 19–32)
Calcium: 9.3 mg/dL (ref 8.4–10.5)
Creatinine, Ser: 0.7 mg/dL (ref 0.4–1.2)
GFR: 108.47 mL/min (ref >60.00)
Glucose, Bld: 96 mg/dL (ref 70–99)

## 2011-08-29 MED ORDER — POTASSIUM CHLORIDE CRYS ER 20 MEQ PO TBCR: EXTENDED_RELEASE_TABLET | ORAL | Status: DC

## 2011-08-29 NOTE — Progress Notes (Signed)
Per Dr Antoine Poche - pt to take 80 mEq of potassium chloride to 2 days then decrease to 40 mEq daily thereafter.  She is to have repeat lab in one week.  Rx was sent in to pharmacy electronically.  Left message for pt on her private voice mail and requested she call back or come by should she have questions.

## 2011-09-05 ENCOUNTER — Other Ambulatory Visit (INDEPENDENT_AMBULATORY_CARE_PROVIDER_SITE_OTHER): Payer: Federal, State, Local not specified - PPO | Admitting: *Deleted

## 2011-09-05 ENCOUNTER — Telehealth: Payer: Self-pay | Admitting: *Deleted

## 2011-09-05 ENCOUNTER — Other Ambulatory Visit: Payer: Self-pay | Admitting: *Deleted

## 2011-09-05 DIAGNOSIS — E876 Hypokalemia: Secondary | ICD-10-CM

## 2011-09-05 DIAGNOSIS — I5022 Chronic systolic (congestive) heart failure: Secondary | ICD-10-CM

## 2011-09-05 LAB — BASIC METABOLIC PANEL
BUN: 14 mg/dL (ref 6–23)
Creatinine, Ser: 0.9 mg/dL (ref 0.4–1.2)
GFR: 86.29 mL/min (ref >60.00)
Potassium: 3.2 mEq/L — ABNORMAL LOW (ref 3.5–5.1)

## 2011-09-05 NOTE — Telephone Encounter (Signed)
9/20--pt's K+ is 3.2--per dr Excell Seltzer, have pt take K+ 9/21 and 9/22--take on 9/23 and come in for repeat BMET on mon 9/24--called pt with instructions and she agrees--nt

## 2011-09-09 ENCOUNTER — Ambulatory Visit (INDEPENDENT_AMBULATORY_CARE_PROVIDER_SITE_OTHER): Payer: Federal, State, Local not specified - PPO | Admitting: *Deleted

## 2011-09-09 DIAGNOSIS — E876 Hypokalemia: Secondary | ICD-10-CM

## 2011-09-09 DIAGNOSIS — Z0000 Encounter for general adult medical examination without abnormal findings: Secondary | ICD-10-CM

## 2011-09-09 LAB — URINALYSIS, ROUTINE W REFLEX MICROSCOPIC
Bilirubin Urine: NEGATIVE
Leukocytes, UA: NEGATIVE
Nitrite: NEGATIVE
Specific Gravity, Urine: 1.01 (ref 1.000–1.030)
pH: 6.5 (ref 5.0–8.0)

## 2011-09-09 LAB — BASIC METABOLIC PANEL
Calcium: 9.2 mg/dL (ref 8.4–10.5)
GFR: 124 mL/min (ref >60.00)
Potassium: 3.2 mEq/L — ABNORMAL LOW (ref 3.5–5.1)
Sodium: 137 mEq/L (ref 135–145)

## 2011-09-09 LAB — LIPID PANEL
HDL: 87.8 mg/dL (ref >39.00)
Triglycerides: 60 mg/dL (ref 0.0–149.0)

## 2011-09-09 LAB — TSH: TSH: 2.08 u[IU]/mL (ref 0.35–5.50)

## 2011-09-09 LAB — CBC WITH DIFFERENTIAL/PLATELET
Basophils Absolute: 0 10*3/uL (ref 0.0–0.1)
Eosinophils Absolute: 0 10*3/uL (ref 0.0–0.7)
HCT: 38.5 % (ref 36.0–46.0)
Lymphs Abs: 2.9 10*3/uL (ref 0.7–4.0)
MCHC: 33.3 g/dL (ref 30.0–36.0)
MCV: 88.4 fl (ref 78.0–100.0)
Monocytes Absolute: 0.4 10*3/uL (ref 0.1–1.0)
Platelets: 205 10*3/uL (ref 150.0–400.0)
RDW: 13.4 % (ref 11.5–14.6)

## 2011-09-09 LAB — HEPATIC FUNCTION PANEL
ALT: 12 U/L (ref 0–35)
Total Bilirubin: 0.4 mg/dL (ref 0.3–1.2)

## 2011-09-09 NOTE — Progress Notes (Signed)
Pt aware of orders and to repeat lab in one week

## 2011-09-10 NOTE — Progress Notes (Signed)
Quick Note:  Voice message left on [employer] system - lab is negative, normal or otherwise stable, pt to continue same tx ______ 

## 2011-09-16 ENCOUNTER — Ambulatory Visit (INDEPENDENT_AMBULATORY_CARE_PROVIDER_SITE_OTHER): Payer: Federal, State, Local not specified - PPO | Admitting: *Deleted

## 2011-09-16 ENCOUNTER — Other Ambulatory Visit: Payer: Self-pay | Admitting: Gynecology

## 2011-09-16 DIAGNOSIS — E876 Hypokalemia: Secondary | ICD-10-CM

## 2011-09-16 DIAGNOSIS — I10 Essential (primary) hypertension: Secondary | ICD-10-CM

## 2011-09-16 DIAGNOSIS — R928 Other abnormal and inconclusive findings on diagnostic imaging of breast: Secondary | ICD-10-CM

## 2011-09-16 LAB — BASIC METABOLIC PANEL
CO2: 30 mEq/L (ref 19–32)
Chloride: 98 mEq/L (ref 96–112)
Sodium: 137 mEq/L (ref 135–145)

## 2011-09-17 MED ORDER — HYDROCHLOROTHIAZIDE 25 MG PO TABS: 25.0000 mg | ORAL_TABLET | Freq: Every day | ORAL | Status: DC

## 2011-09-17 MED ORDER — POTASSIUM CHLORIDE CRYS ER 20 MEQ PO TBCR: 80.0000 meq | EXTENDED_RELEASE_TABLET | Freq: Every day | ORAL | Status: DC

## 2011-09-20 ENCOUNTER — Other Ambulatory Visit (INDEPENDENT_AMBULATORY_CARE_PROVIDER_SITE_OTHER): Payer: Federal, State, Local not specified - PPO | Admitting: *Deleted

## 2011-09-20 DIAGNOSIS — E876 Hypokalemia: Secondary | ICD-10-CM

## 2011-09-20 LAB — BASIC METABOLIC PANEL
BUN: 12 mg/dL (ref 6–23)
Chloride: 100 mEq/L (ref 96–112)
GFR: 79.05 mL/min (ref >60.00)
Glucose, Bld: 119 mg/dL — ABNORMAL HIGH (ref 70–99)
Potassium: 3.1 mEq/L — ABNORMAL LOW (ref 3.5–5.1)
Sodium: 139 mEq/L (ref 135–145)

## 2011-09-26 ENCOUNTER — Ambulatory Visit
Admission: RE | Admit: 2011-09-26 | Discharge: 2011-09-26 | Disposition: A | Discharge status: Home / Self Care | Payer: Federal, State, Local not specified - PPO | Source: Ambulatory Visit | Attending: Gynecology | Admitting: Gynecology

## 2011-09-26 ENCOUNTER — Ambulatory Visit (INDEPENDENT_AMBULATORY_CARE_PROVIDER_SITE_OTHER): Payer: Federal, State, Local not specified - PPO | Admitting: *Deleted

## 2011-09-26 DIAGNOSIS — E876 Hypokalemia: Secondary | ICD-10-CM

## 2011-09-26 DIAGNOSIS — R928 Other abnormal and inconclusive findings on diagnostic imaging of breast: Secondary | ICD-10-CM

## 2011-09-26 LAB — BASIC METABOLIC PANEL
CO2: 28 mEq/L (ref 19–32)
Calcium: 9.4 mg/dL (ref 8.4–10.5)
Chloride: 103 mEq/L (ref 96–112)
Creatinine, Ser: 0.8 mg/dL (ref 0.4–1.2)
Glucose, Bld: 103 mg/dL — ABNORMAL HIGH (ref 70–99)
Sodium: 140 mEq/L (ref 135–145)

## 2011-09-27 ENCOUNTER — Ambulatory Visit (INDEPENDENT_AMBULATORY_CARE_PROVIDER_SITE_OTHER): Payer: Federal, State, Local not specified - PPO | Admitting: Cardiology

## 2011-09-27 ENCOUNTER — Encounter: Payer: Self-pay | Admitting: Cardiology

## 2011-09-27 DIAGNOSIS — R079 Chest pain, unspecified: Secondary | ICD-10-CM

## 2011-09-27 DIAGNOSIS — I6529 Occlusion and stenosis of unspecified carotid artery: Secondary | ICD-10-CM

## 2011-09-27 DIAGNOSIS — Z720 Tobacco use: Secondary | ICD-10-CM

## 2011-09-27 DIAGNOSIS — E876 Hypokalemia: Secondary | ICD-10-CM

## 2011-09-27 DIAGNOSIS — I10 Essential (primary) hypertension: Secondary | ICD-10-CM | POA: Insufficient documentation

## 2011-09-27 DIAGNOSIS — F17200 Nicotine dependence, unspecified, uncomplicated: Secondary | ICD-10-CM

## 2011-09-27 MED ORDER — SPIRONOLACTONE 25 MG PO TABS: 25.0000 mg | ORAL_TABLET | Freq: Every day | ORAL | Status: DC

## 2011-09-27 MED ORDER — POTASSIUM CHLORIDE CRYS ER 20 MEQ PO TBCR: 80.0000 meq | EXTENDED_RELEASE_TABLET | Freq: Every day | ORAL | Status: DC

## 2011-09-27 NOTE — Assessment & Plan Note (Signed)
She has not tolerated thiazide diuretics secondary to hypokalemia.  I will stop this and I will start spironolactone 25 mg daily.  She will reduce the KCL to 20 meq daily and get a BMET early next week.  She needs to keep a blood pressure diary.

## 2011-09-27 NOTE — Assessment & Plan Note (Signed)
Chest pain is atypical and reproducible with palpation.  Recent negative stress testing.  Very unlikely to be a cardiac etiology.  Pain has improved today.  No further work up at this point.

## 2011-09-27 NOTE — Assessment & Plan Note (Signed)
She had stopped taking Chantix because she was "Taking so many pills."  She agrees to restart this.

## 2011-09-27 NOTE — Assessment & Plan Note (Signed)
She had 0 - 39% bilateral plaque.  I will follow this up in 24 months with repeat Doppler.  She needs aggressive risk reduction.

## 2011-09-27 NOTE — Patient Instructions (Addendum)
Stop HCTZ  Start Spironolactone 25 mg a day  Decrease K-Dur 20 mEq daily  Continue all other medications the same  Have lab work next week (around Wednesday)  Follow up with Dr Antoine Poche in one month

## 2011-09-27 NOTE — Progress Notes (Signed)
HPI The patient presents for evaluation of chest discomfort.  She was added to my schedule.  She describes chest discomfort starting about 2 days ago. It is in her left upper chest. It turns out that there is actually a point of tenderness it hurts when she presses or moves a certain way. Though is worse with this activity it is there constantly. It is somewhat sharp. It does not radiate to her neck or to her arms. It is not associated with new shortness of breath, PND or orthopnea. She is not sure whether she's had this before. She was getting a mammogram yesterday and they did find a cyst apparently in this exact location. She is not describing any new palpitations, presyncope or syncope. Of note she had a negative stress test recently. I have been trying to control blood pressure. I have tried HCTZ and chlorthalidone. However, I have not been able to maintain her potassium. No Known Allergies  Current Outpatient Prescriptions  Medication Sig Dispense Refill  . hydrochlorothiazide (HYDRODIURIL) 25 MG tablet Take 1 tablet (25 mg total) by mouth daily.  30 tablet  6  . ibuprofen (ADVIL,MOTRIN) 400 MG tablet Take 400 mg by mouth every 6 (six) hours as needed.        . potassium chloride SA (K-DUR,KLOR-CON) 20 MEQ tablet Take 4 tablets (80 mEq total) by mouth daily. Take 4 tablets for 2 days then 2 tablets daily  120 tablet  3    Past Medical History  Diagnosis Date  . Anemia     Iron def  . History of hepatitis     Type A  . Recurrent boils     Recurrent  . Vitamin D deficiency     Past Surgical History  Procedure Date  . Abdominal hysterectomy     Due to fibroids    Family History  Problem Relation Age of Onset  . Hypertension Sister   . Diabetes Sister   . Arthritis Other     Parent  . Cancer Other     Breast and ovary-sister and cousin  . Heart disease Other     Aunt  . Cancer Cousin     Brain  . Alcohol abuse Cousin     Multiple    History   Social History  . Marital  Status: Divorced    Spouse Name: N/A    Number of Children: N/A  . Years of Education: N/A   Occupational History  . Not on file.   Social History Main Topics  . Smoking status: Current Everyday Smoker -- 0.5 packs/day for 30 years    Types: Cigarettes  . Smokeless tobacco: Never Used   Comment: off and on for 64yrs  . Alcohol Use: Yes  . Drug Use: Yes  . Sexually Active: Not on file   Other Topics Concern  . Not on file   Social History Narrative  . No narrative on file    ROS: As stated in the HPI and negative for all other systems.   PHYSICAL EXAM BP 142/100  Pulse 80  Resp 16  Ht 5\' 7"  (1.702 m)  Wt 135 lb (61.236 kg)  BMI 21.14 kg/m2 GENERAL:  Well appearing HEENT:  Pupils equal round and reactive, fundi not visualized, oral mucosa unremarkable, dentures NECK:  No jugular venous distention, waveform within normal limits, carotid upstroke brisk and symmetric, no bruits, no thyromegaly LYMPHATICS:  No cervical, inguinal adenopathy LUNGS:  Clear to auscultation bilaterally BACK:  No CVA  tenderness CHEST:  Tenderness to palpation, right upper chest HEART:  PMI not displaced or sustained,S1 and S2 within normal limits, no S3, no S4, no clicks, no rubs, no murmurs ABD:  Flat, positive bowel sounds normal in frequency in pitch, no bruits, no rebound, no guarding, no midline pulsatile mass, no hepatomegaly, no splenomegaly EXT:  2 plus pulses throughout, no edema, no cyanosis no clubbing SKIN:  No rashes no nodules NEURO:  Cranial nerves II through XII grossly intact, motor grossly intact throughout PSYCH:  Cognitively intact, oriented to person place and time  EKG:  Rhythm leftward axis, RSR prime V1 and V2, left ventricular hypertrophy inferior T-wave inversion nonspecific, no change from previous.  ASSESSMENT AND PLAN

## 2011-10-02 ENCOUNTER — Other Ambulatory Visit (INDEPENDENT_AMBULATORY_CARE_PROVIDER_SITE_OTHER): Payer: Federal, State, Local not specified - PPO | Admitting: *Deleted

## 2011-10-02 DIAGNOSIS — E876 Hypokalemia: Secondary | ICD-10-CM

## 2011-10-02 DIAGNOSIS — I10 Essential (primary) hypertension: Secondary | ICD-10-CM

## 2011-10-02 LAB — BASIC METABOLIC PANEL
BUN: 11 mg/dL (ref 6–23)
CO2: 25 mEq/L (ref 19–32)
Chloride: 104 mEq/L (ref 96–112)
Creatinine, Ser: 0.9 mg/dL (ref 0.4–1.2)
Glucose, Bld: 98 mg/dL (ref 70–99)

## 2011-10-28 ENCOUNTER — Ambulatory Visit (INDEPENDENT_AMBULATORY_CARE_PROVIDER_SITE_OTHER): Payer: Federal, State, Local not specified - PPO | Admitting: Cardiology

## 2011-10-28 ENCOUNTER — Encounter: Payer: Self-pay | Admitting: Cardiology

## 2011-10-28 DIAGNOSIS — Z79899 Other long term (current) drug therapy: Secondary | ICD-10-CM

## 2011-10-28 DIAGNOSIS — F17200 Nicotine dependence, unspecified, uncomplicated: Secondary | ICD-10-CM

## 2011-10-28 DIAGNOSIS — Z720 Tobacco use: Secondary | ICD-10-CM

## 2011-10-28 DIAGNOSIS — R079 Chest pain, unspecified: Secondary | ICD-10-CM

## 2011-10-28 DIAGNOSIS — I10 Essential (primary) hypertension: Secondary | ICD-10-CM

## 2011-10-28 NOTE — Assessment & Plan Note (Signed)
She says she will start her Chantix today.

## 2011-10-28 NOTE — Patient Instructions (Signed)
Please have blood work today  Follow up in 4 months with Dr Antoine Poche

## 2011-10-28 NOTE — Assessment & Plan Note (Signed)
Her pressure seems to be controlled. She'll continue sporadic lactone I will check a basic metabolic profile.

## 2011-10-28 NOTE — Progress Notes (Signed)
HPI The patient presents for evaluation of chest discomfort and HTN.  She has been on spironolactone.  Since I last saw her she has done well. She's had her blood pressure checked it's been about 140/90. She's not reporting any new chest discomfort though she still has some atypical discomfort she thinks at the site of a cyst that was found on her chest. She's not having any substernal chest pressure, neck or arm discomfort. She's had no new shortness of breath, PND or orthopnea. She's not describing palpitations, presyncope or syncope.  No Known Allergies  Current Outpatient Prescriptions  Medication Sig Dispense Refill  . ibuprofen (ADVIL,MOTRIN) 400 MG tablet Take 400 mg by mouth every 6 (six) hours as needed.        . potassium chloride SA (K-DUR,KLOR-CON) 20 MEQ tablet Take 4 tablets (80 mEq total) by mouth daily. Take 4 tablets for 2 days then 2 tablets daily      . spironolactone (ALDACTONE) 25 MG tablet Take 1 tablet (25 mg total) by mouth daily.  30 tablet  6    Past Medical History  Diagnosis Date  . Anemia     Iron def  . History of hepatitis     Type A  . Recurrent boils     Recurrent  . Vitamin D deficiency     Past Surgical History  Procedure Date  . Abdominal hysterectomy     Due to fibroids   ROS: As stated in the HPI and negative for all other systems.  PHYSICAL EXAM BP 132/83  Pulse 73  Ht 5\' 10"  (1.778 m)  Wt 142 lb (64.411 kg)  BMI 20.37 kg/m2 GENERAL:  Well appearing NECK:  No jugular venous distention, waveform within normal limits, carotid upstroke brisk and symmetric, no bruits, no thyromegaly LYMPHATICS:  No cervical, inguinal adenopathy LUNGS:  Clear to auscultation bilaterally CHEST:  Tenderness to palpation, right upper chest HEART:  PMI not displaced or sustained,S1 and S2 within normal limits, no S3, no S4, no clicks, no rubs, no murmurs ABD:  Flat, positive bowel sounds normal in frequency in pitch, no bruits, no rebound, no guarding, no  midline pulsatile mass, no hepatomegaly, no splenomegaly EXT:  2 plus pulses throughout, no edema, no cyanosis no clubbing  ASSESSMENT AND PLAN

## 2011-10-28 NOTE — Assessment & Plan Note (Signed)
Her chest pain is atypical.  No further testing is indicated.

## 2011-10-29 LAB — BASIC METABOLIC PANEL
BUN: 9 mg/dL (ref 6–23)
Calcium: 9.3 mg/dL (ref 8.4–10.5)
Creatinine, Ser: 1.4 mg/dL — ABNORMAL HIGH (ref 0.4–1.2)
GFR: 51.98 mL/min — ABNORMAL LOW (ref >60.00)
Glucose, Bld: 84 mg/dL (ref 70–99)
Sodium: 144 mEq/L (ref 135–145)

## 2011-10-31 ENCOUNTER — Other Ambulatory Visit: Payer: Self-pay | Admitting: *Deleted

## 2011-10-31 DIAGNOSIS — I10 Essential (primary) hypertension: Secondary | ICD-10-CM

## 2011-11-11 ENCOUNTER — Other Ambulatory Visit (INDEPENDENT_AMBULATORY_CARE_PROVIDER_SITE_OTHER): Payer: Federal, State, Local not specified - PPO | Admitting: *Deleted

## 2011-11-11 DIAGNOSIS — I10 Essential (primary) hypertension: Secondary | ICD-10-CM

## 2011-11-11 LAB — BASIC METABOLIC PANEL
BUN: 10 mg/dL (ref 6–23)
Calcium: 9.3 mg/dL (ref 8.4–10.5)
Creatinine, Ser: 0.8 mg/dL (ref 0.4–1.2)
GFR: 105.05 mL/min (ref >60.00)

## 2011-11-13 ENCOUNTER — Encounter: Payer: Self-pay | Admitting: Internal Medicine

## 2011-11-13 ENCOUNTER — Ambulatory Visit (INDEPENDENT_AMBULATORY_CARE_PROVIDER_SITE_OTHER): Payer: Federal, State, Local not specified - PPO | Admitting: Internal Medicine

## 2011-11-13 VITALS — BP 120/82 | HR 77 | Temp 97.9°F | Ht 67.0 in | Wt 142.4 lb

## 2011-11-13 DIAGNOSIS — Z0000 Encounter for general adult medical examination without abnormal findings: Secondary | ICD-10-CM

## 2011-11-13 DIAGNOSIS — L723 Sebaceous cyst: Secondary | ICD-10-CM | POA: Insufficient documentation

## 2011-11-13 NOTE — Assessment & Plan Note (Signed)
Overall doing well, age appropriate education and counseling updated, referrals for preventative services and immunizations addressed, dietary and smoking counseling addressed, most recent labs and ECG reviewed.  I have personally reviewed and have noted: 1) the patient's medical and social history 2) The pt's use of alcohol, tobacco, and illicit drugs 3) The patient's current medications and supplements 4) Functional ability including ADL's, fall risk, home safety risk, hearing and visual impairment 5) Diet and physical activities 6) Evidence for depression or mood disorder 7) The patient's height, weight, and BMI have been recorded in the chart I have made referrals, and provided counseling and education based on review of the above Declines colonoscopy/.  Labs reviewed for pt from 2 mo ago

## 2011-11-13 NOTE — Patient Instructions (Signed)
Continue all other medications as before Please call if you would like to have the screening colonoscopy scheduled Please remember to followup with your GYN for the yearly pap smear and/or mammogram as you do You are otherwise up to date with prevention at this time Please return in 1 year for your yearly visit, or sooner if needed, with Lab testing done 3-5 days before

## 2011-11-16 ENCOUNTER — Encounter: Payer: Self-pay | Admitting: Internal Medicine

## 2011-11-16 NOTE — Progress Notes (Signed)
Subjective:    Patient ID: Danielle Williams, female    DOB: [birth year], 50 y.o.   MRN: [id number]  HPI Here for wellness and f/u;  Overall doing ok;  Pt denies CP, worsening SOB, DOE, wheezing, orthopnea, PND, worsening LE edema, palpitations, dizziness or syncope.  Pt denies neurological change such as new Headache, facial or extremity weakness.  Pt denies polydipsia, polyuria, or low sugar symptoms. Pt states overall good compliance with treatment and medications, good tolerability, and trying to follow lower cholesterol diet.  Pt denies worsening depressive symptoms, suicidal ideation or panic. No fever, wt loss, night sweats, loss of appetite, or other constitutional symptoms.  Pt states good ability with ADL's, low fall risk, home safety reviewed and adequate, no significant changes in hearing or vision, and occasionally active with exercise.  Has left mid chest small cystic lesion, no pain but still palpable, no change in size recent Past Medical History  Diagnosis Date  . Anemia     Iron def  . History of hepatitis     Type A  . Recurrent boils     Recurrent  . Vitamin D deficiency    Past Surgical History  Procedure Date  . Abdominal hysterectomy     Due to fibroids    reports that she has been smoking Cigarettes.  She has a 15 pack-year smoking history. She has never used smokeless tobacco. She reports that she drinks alcohol. She reports that she uses illicit drugs. family history includes Alcohol abuse in her cousin; Arthritis in her other; Cancer in her cousin and other; Diabetes in her sister; Heart disease in her other; and Hypertension in her sister. No Known Allergies Current Outpatient Prescriptions on File Prior to Visit  Medication Sig Dispense Refill  . ibuprofen (ADVIL,MOTRIN) 400 MG tablet Take 400 mg by mouth every 6 (six) hours as needed.        . potassium chloride SA (K-DUR,KLOR-CON) 20 MEQ tablet Take 4 tablets (80 mEq total) by mouth daily. Take 4 tablets for 2  days then 2 tablets daily      . spironolactone (ALDACTONE) 25 MG tablet Take 1 tablet (25 mg total) by mouth daily.  30 tablet  6   Review of Systems Review of Systems  Constitutional: Negative for diaphoresis, activity change, appetite change and unexpected weight change.  HENT: Negative for hearing loss, ear pain, facial swelling, mouth sores and neck stiffness.   Eyes: Negative for pain, redness and visual disturbance.  Respiratory: Negative for shortness of breath and wheezing.   Cardiovascular: Negative for chest pain and palpitations.  Gastrointestinal: Negative for diarrhea, blood in stool, abdominal distention and rectal pain.  Genitourinary: Negative for hematuria, flank pain and decreased urine volume.  Musculoskeletal: Negative for myalgias and joint swelling.  Skin: Negative for color change and wound.  Neurological: Negative for syncope and numbness.  Hematological: Negative for adenopathy.  Psychiatric/Behavioral: Negative for hallucinations, self-injury, decreased concentration and agitation.      Objective:   Physical Exam BP 120/82  Pulse 77  Temp(Src) 97.9 F (36.6 C) (Oral)  Ht 5\' 7"  (1.702 m)  Wt 142 lb 6 oz (64.581 kg)  BMI 22.30 kg/m2  SpO2 98% Physical Exam  VS noted Constitutional: Pt is oriented to person, place, and time. Appears well-developed and well-nourished.  HENT:  Head: Normocephalic and atraumatic.  Right Ear: External ear normal.  Left Ear: External ear normal.  Nose: Nose normal.  Mouth/Throat: Oropharynx is clear and moist.  Eyes: Conjunctivae and  EOM are normal. Pupils are equal, round, and reactive to light.  Neck: Normal range of motion. Neck supple. No JVD present. No tracheal deviation present.  Cardiovascular: Normal rate, regular rhythm, normal heart sounds and intact distal pulses.   Pulmonary/Chest: Effort normal and breath sounds normal.  Abdominal: Soft. Bowel sounds are normal. There is no tenderness.  Musculoskeletal:  Normal range of motion. Exhibits no edema.  Lymphadenopathy:  Has no cervical adenopathy.  Neurological: Pt is alert and oriented to person, place, and time. Pt has normal reflexes. No cranial nerve deficit.  Skin: Skin is warm and dry. No rash noted. 5 mm seb cyst palpable left mid sternal subq Psychiatric:  Has  normal mood and affect. Behavior is normal.     Assessment & Plan:

## 2011-11-16 NOTE — Assessment & Plan Note (Signed)
Benign, reassured, to follow

## 2011-12-26 ENCOUNTER — Encounter: Payer: Self-pay | Admitting: *Deleted

## 2011-12-26 ENCOUNTER — Encounter: Payer: Self-pay | Admitting: Internal Medicine

## 2011-12-26 ENCOUNTER — Ambulatory Visit (INDEPENDENT_AMBULATORY_CARE_PROVIDER_SITE_OTHER): Payer: Federal, State, Local not specified - PPO | Admitting: Internal Medicine

## 2011-12-26 VITALS — BP 120/88 | HR 84 | Temp 98.3°F

## 2011-12-26 DIAGNOSIS — J209 Acute bronchitis, unspecified: Secondary | ICD-10-CM

## 2011-12-26 DIAGNOSIS — J069 Acute upper respiratory infection, unspecified: Secondary | ICD-10-CM

## 2011-12-26 MED ORDER — AZITHROMYCIN 250 MG PO TABS: ORAL_TABLET | ORAL | Status: AC

## 2011-12-26 MED ORDER — HYDROCODONE-HOMATROPINE 5-1.5 MG/5ML PO SYRP: 5.0000 mL | ORAL_SOLUTION | Freq: Three times a day (TID) | ORAL | Status: AC | PRN

## 2011-12-26 NOTE — Progress Notes (Signed)
  Subjective:    HPI  complains of cold symptoms  Onset >1 week ago, wax/wane symptoms  Initially associated with rhinorrhea, sneezing, sore throat, mild headache and low grade fever Now myalgias, sinus pressure and mild-mod chest congestion> green or yellow thick sputum/cough worse at night> unable to sleep No relief with OTC meds Precipitated by sick contacts  Past Medical History  Diagnosis Date  . Anemia     Iron def  . History of hepatitis     Type A  . Recurrent boils     Recurrent  . Vitamin D deficiency     Review of Systems Constitutional: Subjective fever but no night sweats, no unexpected weight change Pulmonary: No pleurisy or hemoptysis Cardiovascular: No chest pain or palpitations     Objective:   Physical Exam BP 120/88  Pulse 84  Temp(Src) 98.3 F (36.8 C) (Oral)  SpO2 95% GEN: mildly ill appearing and audible head/chest congestion HENT: NCAT, mild sinus tenderness bilaterally, nares with clear discharge, oropharynx mild erythema, no exudate Eyes: Vision grossly intact, no conjunctivitis Lungs: scattered rhonchi , no wheeze, no increased work of breathing Cardiovascular: Regular rate and rhythm, no bilateral edema      Assessment & Plan:  Viral URI with cough Acute bronchitis   Explained lack of efficacy for antibiotics in viral disease but empiric antibiotics prescribed due to symptom duration Prescription cough suppression - new prescriptions done Symptomatic care with Tylenol or Advil, hydration and rest -  salt gargle advised as needed Work excuse note

## 2011-12-26 NOTE — Patient Instructions (Signed)
It was good to see you today. Zpak antibiotics and Hydromet cough syrup - Your prescription(s) have been submitted to your pharmacy. Please take as directed and contact our office if you believe you are having problem(s) with the medication(s). Alternate between ibuprofen and tylenol for aches, pain and fever symptoms as discussed Work excuse note provided as requested -  use warm salt water gargle for throat symptoms as needed Rest, keep hydrated and call if symptoms worse or unimproved  Salt Water Gargle This solution will help make your mouth and throat feel better. HOME CARE INSTRUCTIONS    Mix 1 teaspoon of salt in 8 ounces of warm water.     Gargle with this solution as much or often as you need or as directed. Swish and gargle gently if you have any sores or wounds in your mouth.     Do not swallow this mixture.  Document Released: 09/05/2004 Document Revised: 08/14/2011 Document Reviewed: 01/27/2009 [facility] Patient Information 2012 [place], [place].  Upper Respiratory Infection, Adult An upper respiratory infection (URI) is also known as the common cold. It is often caused by a type of germ (virus). Colds are easily spread (contagious). You can pass it to others by kissing, coughing, sneezing, or drinking out of the same glass. Usually, you get better in 1 or 2 weeks.   HOME CARE    Only take medicine as told by your doctor.     Use a warm mist humidifier or breathe in steam from a hot shower.     Drink enough water and fluids to keep your pee (urine) clear or pale yellow.     Get plenty of rest.     Return to work when your temperature is back to normal or as told by your doctor. You may use a face mask and wash your hands to stop your cold from spreading.  GET HELP RIGHT AWAY IF:    After the first few days, you feel you are getting worse.     You have questions about your medicine.     You have chills, shortness of breath, or brown or red spit (mucus).     You have  yellow or brown snot (nasal discharge) or pain in the face, especially when you bend forward.     You have a fever, puffy (swollen) neck, pain when you swallow, or white spots in the back of your throat.     You have a bad headache, ear pain, sinus pain, or chest pain.     You have a high-pitched whistling sound when you breathe in and out (wheezing).     You have a lasting cough or cough up blood.     You have sore muscles or a stiff neck.  MAKE SURE YOU:    Understand these instructions.     Will watch your condition.     Will get help right away if you are not doing well or get worse.  Document Released: 05/20/2008 Document Revised: 08/14/2011 Document Reviewed: 04/08/2011 [facility] Patient Information 2012 [place], [place].

## 2012-01-14 ENCOUNTER — Ambulatory Visit: Payer: Federal, State, Local not specified - PPO | Admitting: Internal Medicine

## 2012-03-02 ENCOUNTER — Ambulatory Visit (INDEPENDENT_AMBULATORY_CARE_PROVIDER_SITE_OTHER): Payer: Federal, State, Local not specified - PPO | Admitting: Cardiology

## 2012-03-02 ENCOUNTER — Encounter: Payer: Self-pay | Admitting: Cardiology

## 2012-03-02 DIAGNOSIS — I6529 Occlusion and stenosis of unspecified carotid artery: Secondary | ICD-10-CM

## 2012-03-02 DIAGNOSIS — R079 Chest pain, unspecified: Secondary | ICD-10-CM

## 2012-03-02 DIAGNOSIS — Z720 Tobacco use: Secondary | ICD-10-CM

## 2012-03-02 DIAGNOSIS — I10 Essential (primary) hypertension: Secondary | ICD-10-CM

## 2012-03-02 DIAGNOSIS — F17200 Nicotine dependence, unspecified, uncomplicated: Secondary | ICD-10-CM

## 2012-03-02 LAB — BASIC METABOLIC PANEL
Calcium: 9.7 mg/dL (ref 8.4–10.5)
GFR: 98.81 mL/min (ref >60.00)
Potassium: 3.7 mEq/L (ref 3.5–5.1)
Sodium: 139 mEq/L (ref 135–145)

## 2012-03-02 NOTE — Assessment & Plan Note (Signed)
Her blood pressure is well controlled.  I don't think that it is running too low. I will check a BMET today.  She will continue the current medications.

## 2012-03-02 NOTE — Assessment & Plan Note (Signed)
We discussed this again today. Hopefully a completely abstain someday.

## 2012-03-02 NOTE — Assessment & Plan Note (Signed)
She had 0 - 39% bilateral plaque.  I will follow this up in 24 months from the date of the previous with repeat Doppler.  She needs aggressive risk reduction.

## 2012-03-02 NOTE — Patient Instructions (Signed)
The current medical regimen is effective;  continue present plan and medications.  Follow up in 6 months with Dr Hochrein.  You will receive a letter in the mail 2 months before you are due.  Please call us when you receive this letter to schedule your follow up appointment.  

## 2012-03-02 NOTE — Progress Notes (Signed)
   HPI The patient presents for evaluation of HTN.  She has been on spironolactone.  Since I last saw her she has done well. She doesn't check her blood pressures routinely but when she does they've been much better controlled than previous. She's actually had a little lightheadedness particularly over the last few days though she has had some URI symptoms. There's been no presyncope or syncope. There is no new shortness of breath, PND or orthopnea. Has no chest discomfort. She does have some mild orthostatic symptoms.  No Known Allergies  Current Outpatient Prescriptions  Medication Sig Dispense Refill  . ibuprofen (ADVIL,MOTRIN) 400 MG tablet Take 400 mg by mouth every 6 (six) hours as needed.        . potassium chloride SA (K-DUR,KLOR-CON) 20 MEQ tablet Take 4 tablets (80 mEq total) by mouth daily. Take 4 tablets for 2 days then 2 tablets daily      . spironolactone (ALDACTONE) 25 MG tablet Take 1 tablet (25 mg total) by mouth daily.  30 tablet  6    Past Medical History  Diagnosis Date  . Anemia     Iron def  . History of hepatitis     Type A  . Recurrent boils     Recurrent  . Vitamin d deficiency     Past Surgical History  Procedure Date  . Abdominal hysterectomy     Due to fibroids   ROS: As stated in the HPI and negative for all other systems.  PHYSICAL EXAM BP 120/84  Pulse 83  Ht 5\' 7"  (1.702 m)  Wt 145 lb (65.772 kg)  BMI 22.71 kg/m2 GENERAL:  Well appearing NECK:  No jugular venous distention, waveform within normal limits, carotid upstroke brisk and symmetric, no bruits, no thyromegaly LYMPHATICS:  No cervical, inguinal adenopathy LUNGS:  Clear to auscultation bilaterally CHEST:  Tenderness to palpation, right upper chest HEART:  PMI not displaced or sustained,S1 and S2 within normal limits, no S3, no S4, no clicks, no rubs, no murmurs ABD:  Flat, positive bowel sounds normal in frequency in pitch, no bruits, no rebound, no guarding, no midline pulsatile mass,  no hepatomegaly, no splenomegaly EXT:  2 plus pulses throughout, no edema, no cyanosis no clubbing  EKG:  Sinus rhythm, rate 83, axis within normal limits, intervals within normal limits, incomplete right bundle branch block, nonspecific T-wave flattening, no change from previous 03/02/2012  ASSESSMENT AND PLAN

## 2012-04-01 IMAGING — US US BREAST LEFT
1 series · 7 of 7 positions shown · non-contrast
Comparison: Previous examinations, including the screening
mammogram dated 09/09/2011.

CLINICAL DATA: Possible left breast architectural distortion at
recent screening mammography.

DIGITAL DIAGNOSTIC LEFT MAMMOGRAM WITH CAD AND LEFT BREAST
ULTRASOUND:

[Series 1: us breast left · 7 of 7 slices shown]
[im 1/7]
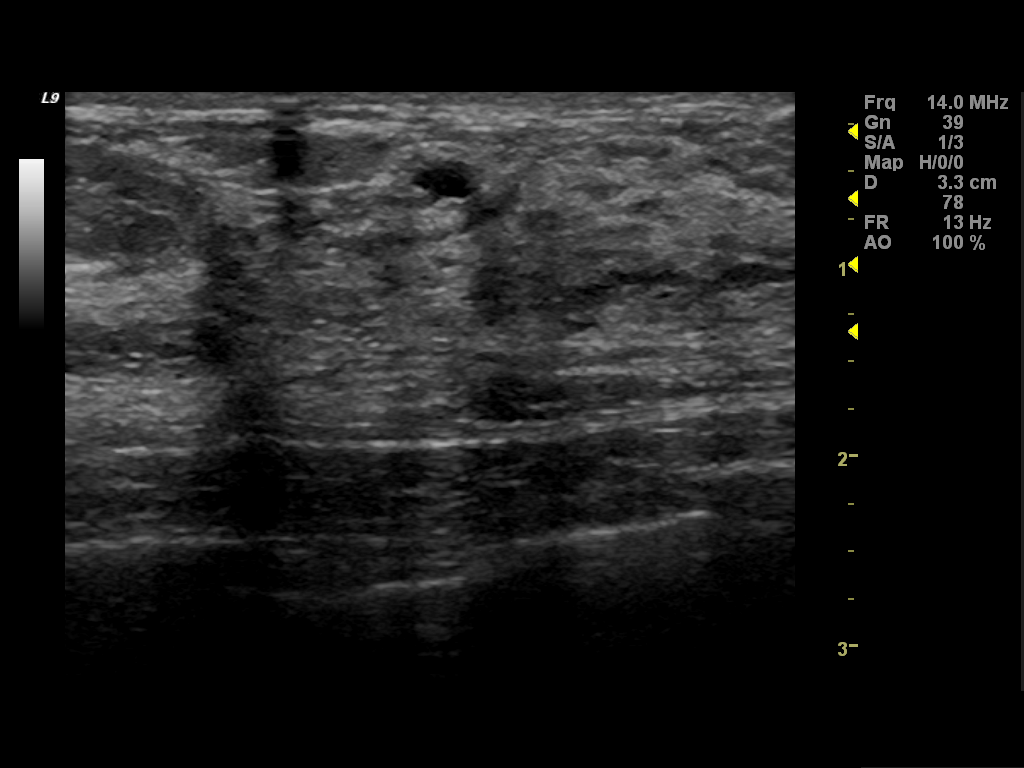
[im 2/7]
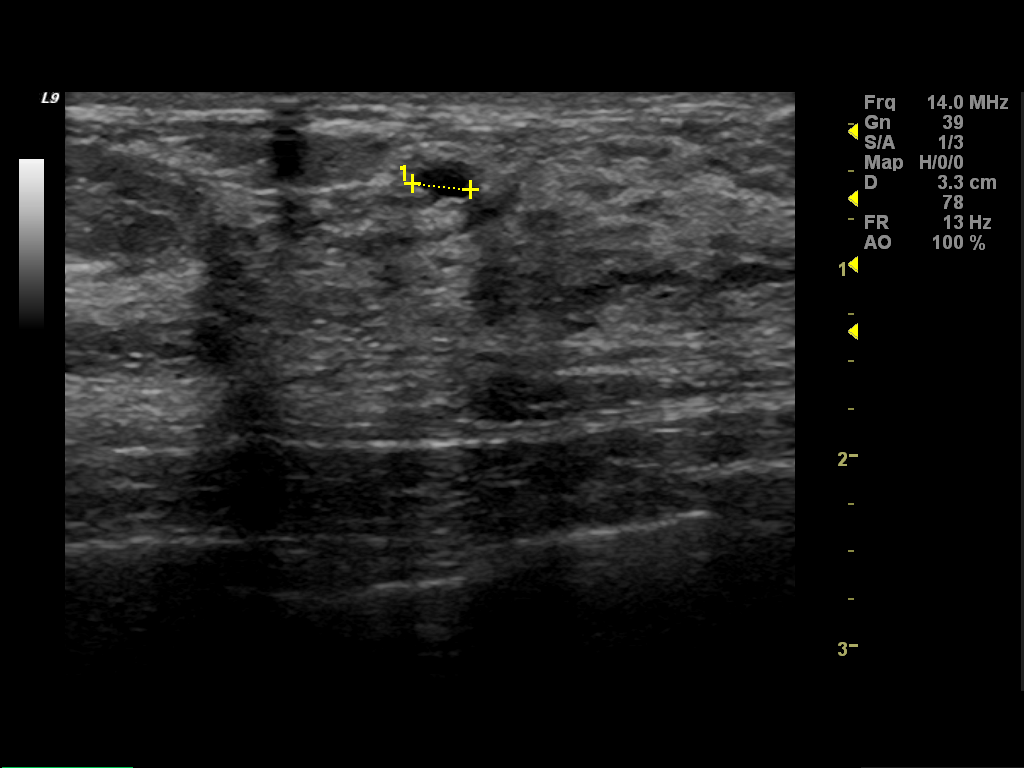
[im 3/7]
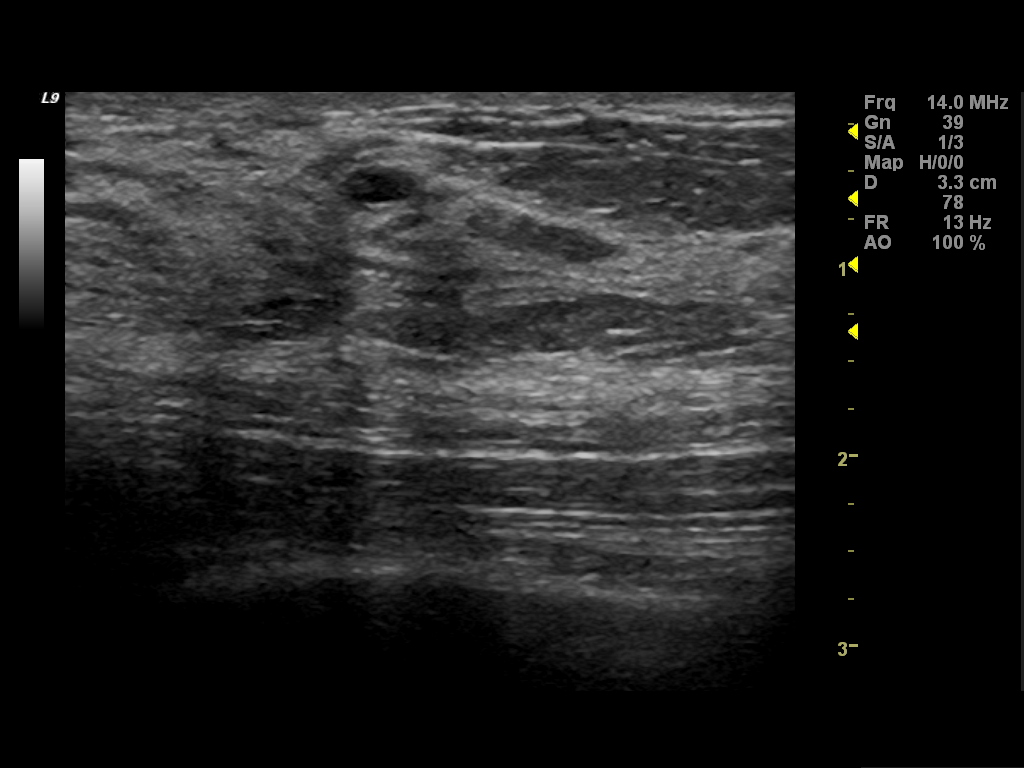
[im 4/7]
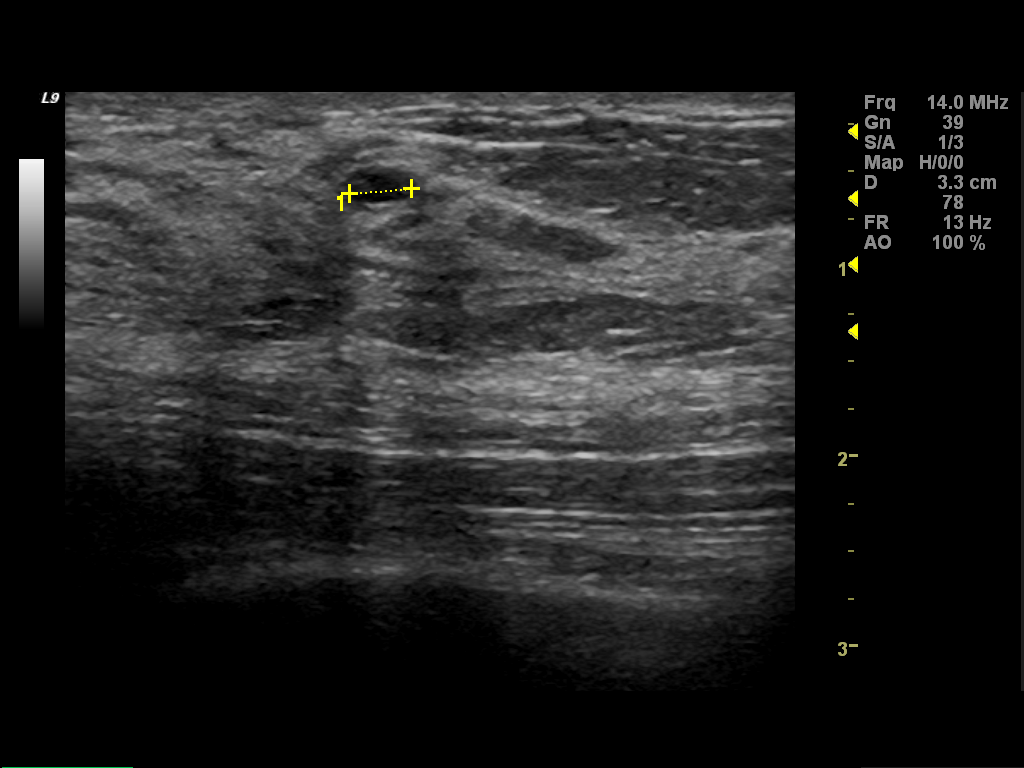
[im 5/7]
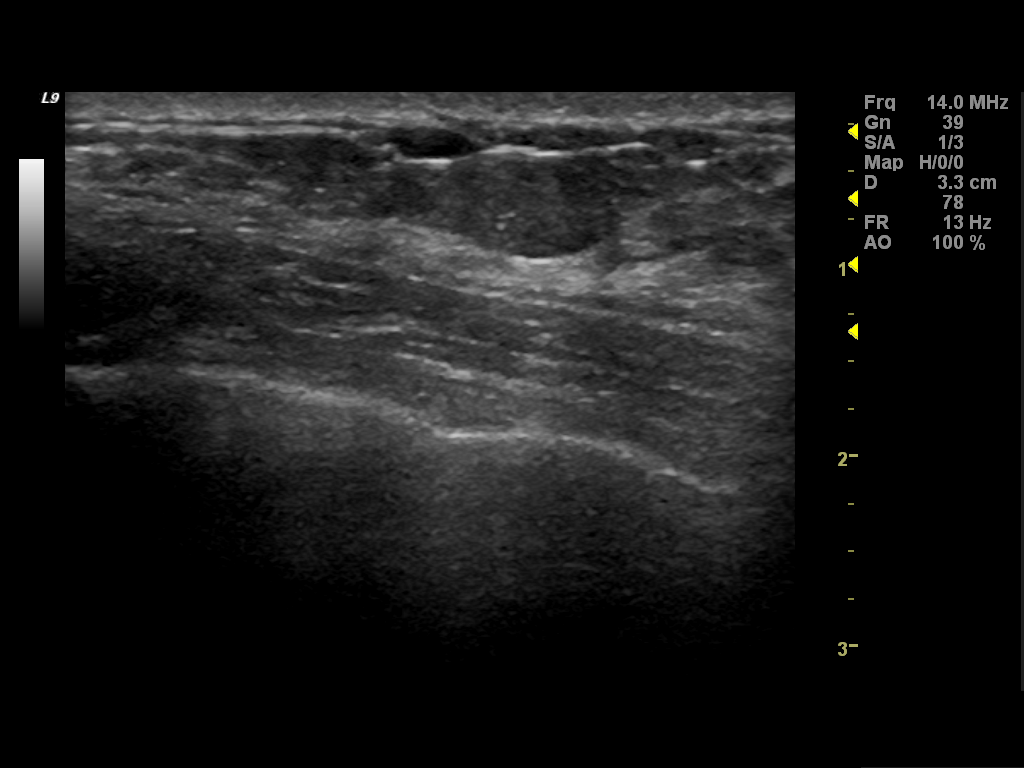
[im 6/7]
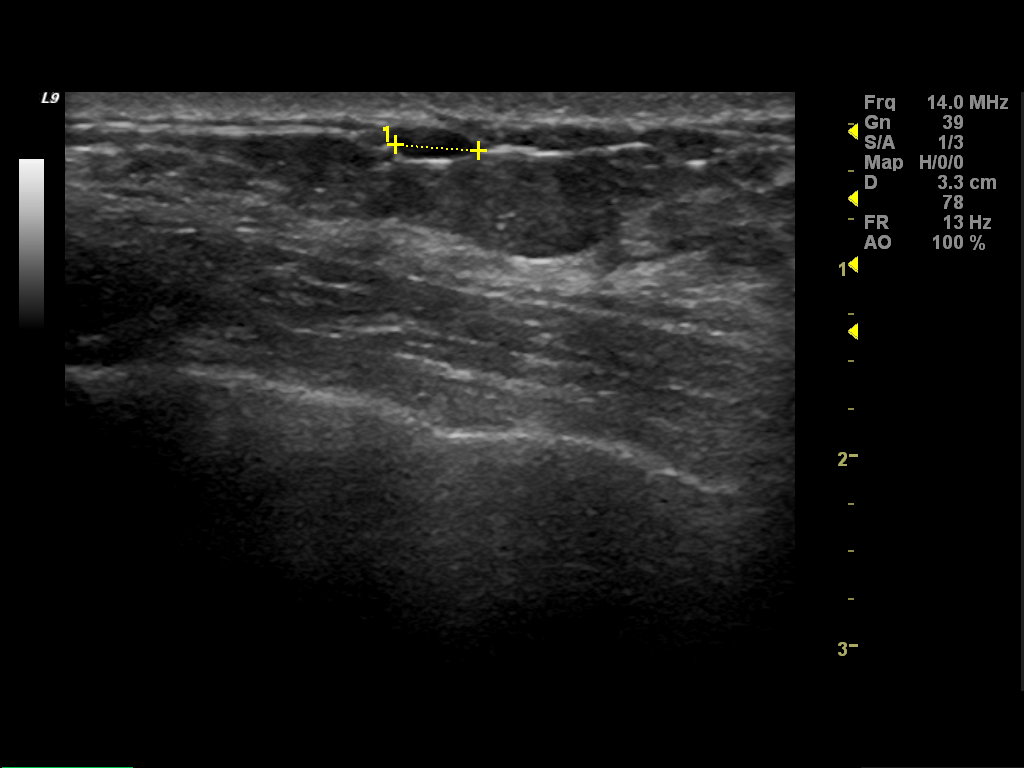
[im 7/7]
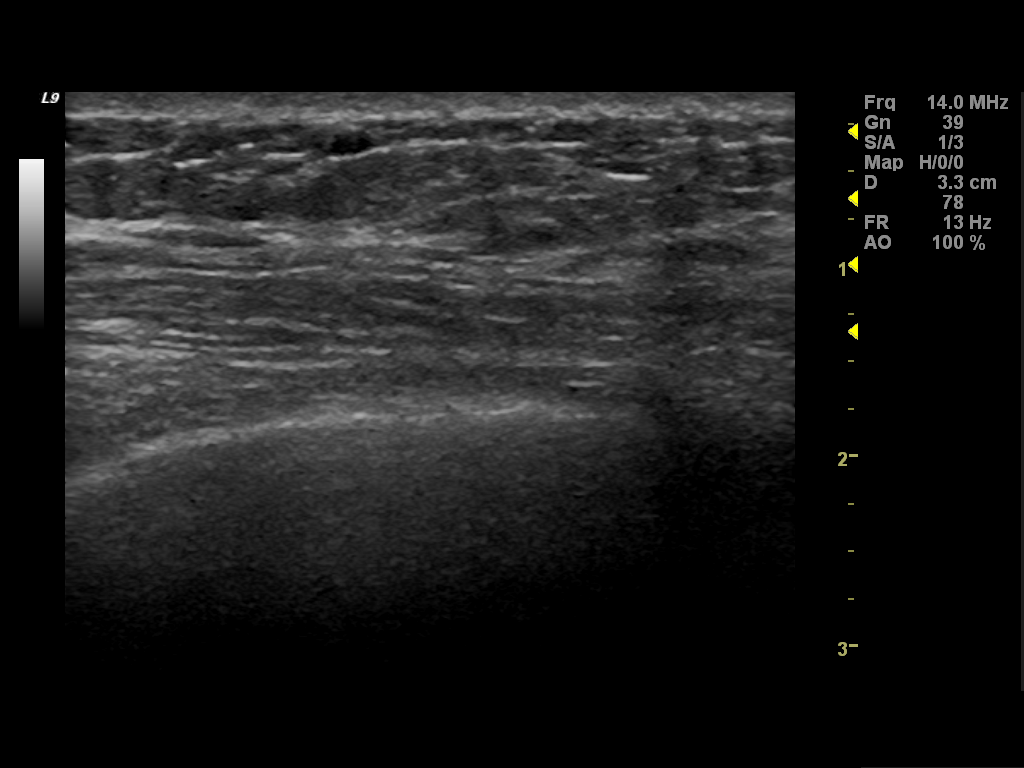

[7 of 7 positions shown; findings below may reference images not displayed]

FINDINGS: True lateral and spot compression craniocaudal views of
the left breast demonstrate normal appearing dense glandular tissue
in the region of the recently suspected architectural distortion,
similar to the previous examinations.
Mammographic images were processed with CAD.

On physical exam, no mass is palpable in the outer left breast.

Ultrasound is performed, showing normal appearing breast tissue
throughout the outer left breast.  There is also a 3 mm cyst in the
1:30 o'clock subareolar region of the left breast.

The patient also reported a focal pain in the upper inner left
breast.  There was no palpable mass at that location.  Sonographic
examination of that area demonstrated a 4 mm cyst.
IMPRESSION: Left breast cysts.  No evidence of malignancy.  Annual screening
mammography is recommended.

BI-RADS CATEGORY 2:  Benign finding(s).

Recommendation:  Bilateral screening mammogram in 1 year.

## 2012-06-05 ENCOUNTER — Other Ambulatory Visit: Payer: Self-pay | Admitting: *Deleted

## 2012-06-05 DIAGNOSIS — I10 Essential (primary) hypertension: Secondary | ICD-10-CM

## 2012-06-05 MED ORDER — SPIRONOLACTONE 25 MG PO TABS: 25.0000 mg | ORAL_TABLET | Freq: Every day | ORAL | Status: DC

## 2012-07-14 ENCOUNTER — Ambulatory Visit (INDEPENDENT_AMBULATORY_CARE_PROVIDER_SITE_OTHER): Payer: Federal, State, Local not specified - PPO | Admitting: Internal Medicine

## 2012-07-14 ENCOUNTER — Encounter: Payer: Self-pay | Admitting: Internal Medicine

## 2012-07-14 VITALS — BP 118/92 | HR 72 | Temp 97.4°F | Ht 67.0 in | Wt 141.4 lb

## 2012-07-14 DIAGNOSIS — IMO0002 Reserved for concepts with insufficient information to code with codable children: Secondary | ICD-10-CM

## 2012-07-14 DIAGNOSIS — L03119 Cellulitis of unspecified part of limb: Secondary | ICD-10-CM | POA: Insufficient documentation

## 2012-07-14 DIAGNOSIS — I10 Essential (primary) hypertension: Secondary | ICD-10-CM

## 2012-07-14 MED ORDER — DOXYCYCLINE HYCLATE 100 MG PO TABS: 100.0000 mg | ORAL_TABLET | Freq: Two times a day (BID) | ORAL | Status: AC

## 2012-07-14 NOTE — Patient Instructions (Addendum)
Take all new medications as prescribed Continue all other medications as before Please check your blood pressure on a regular basis.  Your goal is to be less than 140/90

## 2012-07-14 NOTE — Assessment & Plan Note (Signed)
Mild to mod, for antibx course,  to f/u any worsening symptoms or concerns 

## 2012-07-14 NOTE — Progress Notes (Signed)
  Subjective:    Patient ID: Danielle Williams, female    DOB: [birth year], 51 y.o.   MRN: [id number]  HPI  Here after insect sting to right elbow with minor irritation 3 days ago and did not initially think much of it, but then 2 days ago with marked onset rapidly worsening red/swelling/tender area without drainage; has hx of recurrent boils but not a boil this time; no fever, chills but does feel general malaise and weakness.  Redness may be starting to move more proximal. No trauma.  Pt denies chest pain, increased sob or doe, wheezing, orthopnea, PND, increased LE swelling, palpitations, dizziness or syncope.   Pt denies polydipsia, polyuria.  Past Medical History  Diagnosis Date  . Anemia     Iron def  . History of hepatitis     Type A  . Recurrent boils     Recurrent  . Vitamin d deficiency    Past Surgical History  Procedure Date  . Abdominal hysterectomy     Due to fibroids    reports that she has been smoking Cigarettes.  She has a 15 pack-year smoking history. She has never used smokeless tobacco. She reports that she drinks alcohol. She reports that she uses illicit drugs. family history includes Alcohol abuse in her cousin; Arthritis in her other; Cancer in her cousin and other; Diabetes in her sister; Heart disease in her other; and Hypertension in her sister. No Known Allergies Current Outpatient Prescriptions on File Prior to Visit  Medication Sig Dispense Refill  . ibuprofen (ADVIL,MOTRIN) 400 MG tablet Take 400 mg by mouth every 6 (six) hours as needed.        Marland Kitchen spironolactone (ALDACTONE) 25 MG tablet Take 1 tablet (25 mg total) by mouth daily.  30 tablet  6  . DISCONTD: potassium chloride SA (K-DUR,KLOR-CON) 20 MEQ tablet Take 4 tablets (80 mEq total) by mouth daily. Take 4 tablets for 2 days then 2 tablets daily       Review of Systems All otherwise neg per pt     Objective:   Physical Exam BP 118/92  Pulse 72  Temp 97.4 F (36.3 C) (Oral)  Ht 5\' 7"  (1.702 m)  Wt  141 lb 6 oz (64.127 kg)  BMI 22.14 kg/m2  SpO2 98% Physical Exam  VS noted, not ill apperaing Constitutional: Pt appears well-developed and well-nourished.  HENT: Head: Normocephalic.  Right Ear: External ear normal.  Left Ear: External ear normal.  Eyes: Conjunctivae and EOM are normal. Pupils are equal, round, and reactive to light.  Neck: Normal range of motion. Neck supple.  Cardiovascular: Normal rate and regular rhythm.   Pulmonary/Chest: Effort normal and breath sounds normal.  Neurological: Pt is alert Skin: right post elbow with 3 cm area red/tender/sweling/induration without fluctuance or drainage    Assessment & Plan:

## 2012-07-14 NOTE — Assessment & Plan Note (Signed)
stable overall by hx and exam, most recent data reviewed with pt, and pt to continue medical treatment as before BP Readings from Last 3 Encounters:  07/14/12 118/92  03/02/12 120/84  12/26/11 120/88

## 2012-09-03 ENCOUNTER — Ambulatory Visit (INDEPENDENT_AMBULATORY_CARE_PROVIDER_SITE_OTHER): Payer: Federal, State, Local not specified - PPO | Admitting: Cardiology

## 2012-09-03 ENCOUNTER — Encounter: Payer: Self-pay | Admitting: Cardiology

## 2012-09-03 VITALS — BP 143/86 | HR 75 | Ht 67.0 in | Wt 139.8 lb

## 2012-09-03 DIAGNOSIS — I10 Essential (primary) hypertension: Secondary | ICD-10-CM

## 2012-09-03 NOTE — Patient Instructions (Addendum)
The current medical regimen is effective;  continue present plan and medications.  Please have blood work today  (BMP)  Your physician has requested that you have a carotid duplex in August of 2014. This test is an ultrasound of the carotid arteries in your neck. It looks at blood flow through these arteries that supply the brain with blood. Allow one hour for this exam. There are no restrictions or special instructions.  Follow up with Dr Antoine Poche in August 2014.

## 2012-09-03 NOTE — Progress Notes (Signed)
   HPI The patient presents for evaluation of HTN.  Since I last saw her she has done well.  The patient denies any new symptoms such as chest discomfort, neck or arm discomfort. There has been no new shortness of breath, PND or orthopnea. There have been no reported palpitations, presyncope or syncope.  She is running now for exercise.   She denies any chest pain.  No Known Allergies  Current Outpatient Prescriptions  Medication Sig Dispense Refill  . aspirin 81 MG tablet Take 81 mg by mouth daily.      Marland Kitchen ibuprofen (ADVIL,MOTRIN) 400 MG tablet Take 400 mg by mouth every 6 (six) hours as needed.        . potassium chloride SA (K-DUR,KLOR-CON) 20 MEQ tablet Take 20 mEq by mouth daily.      Marland Kitchen spironolactone (ALDACTONE) 25 MG tablet Take 1 tablet (25 mg total) by mouth daily.  30 tablet  6    Past Medical History  Diagnosis Date  . Anemia     Iron def  . History of hepatitis     Type A  . Recurrent boils     Recurrent  . Vitamin d deficiency     Past Surgical History  Procedure Date  . Abdominal hysterectomy     Due to fibroids   ROS: As stated in the HPI and negative for all other systems.  PHYSICAL EXAM BP 143/86  Pulse 75  Ht 5\' 7"  (1.702 m)  Wt 139 lb 12.8 oz (63.413 kg)  BMI 21.90 kg/m2 GENERAL:  Well appearing NECK:  No jugular venous distention, waveform within normal limits, carotid upstroke brisk and symmetric, no bruits, no thyromegaly LYMPHATICS:  No cervical, inguinal adenopathy LUNGS:  Clear to auscultation bilaterally CHEST:  Tenderness to palpation, right upper chest HEART:  PMI not displaced or sustained,S1 and S2 within normal limits, no S3, no S4, no clicks, no rubs, no murmurs ABD:  Flat, positive bowel sounds normal in frequency in pitch, no bruits, no rebound, no guarding, no midline pulsatile mass, no hepatomegaly, no splenomegaly EXT:  2 plus pulses throughout, no edema, no cyanosis no clubbing  EKG:  Sinus rhythm, rate 75, axis within normal limits,  intervals within normal limits, incomplete right bundle branch block, nonspecific T-wave flattening, no change from previous 09/03/2012  ASSESSMENT AND PLAN  HTN (hypertension) - Her blood pressure is well controlled. I will check a BMET today. She will continue the current medications.  Tobacco abuse -  She still smokes a couple of cigarettes.  I told her that runners don't smoke.  Carotid artery stenosis -  She had 0 - 39% bilateral plaque. I will follow this up August of next year.

## 2012-09-04 LAB — BASIC METABOLIC PANEL
CO2: 29 mEq/L (ref 19–32)
Chloride: 102 mEq/L (ref 96–112)
Sodium: 137 mEq/L (ref 135–145)

## 2012-09-07 ENCOUNTER — Telehealth: Payer: Self-pay

## 2012-09-07 NOTE — Telephone Encounter (Signed)
patient aware of lab results 

## 2012-09-07 NOTE — Telephone Encounter (Signed)
Message copied by Yolonda Kida on Mon Sep 07, 2012  2:40 PM ------      Message from: Rollene Rotunda      Created: Sun Sep 06, 2012  3:52 PM       Labs OK.

## 2012-11-16 ENCOUNTER — Other Ambulatory Visit (INDEPENDENT_AMBULATORY_CARE_PROVIDER_SITE_OTHER): Payer: Federal, State, Local not specified - PPO

## 2012-11-16 DIAGNOSIS — Z0000 Encounter for general adult medical examination without abnormal findings: Secondary | ICD-10-CM

## 2012-11-16 LAB — URINALYSIS, ROUTINE W REFLEX MICROSCOPIC
Hgb urine dipstick: NEGATIVE
Nitrite: NEGATIVE
Specific Gravity, Urine: 1.025 (ref 1.000–1.030)
Total Protein, Urine: NEGATIVE
Urine Glucose: NEGATIVE
Urobilinogen, UA: 0.2 (ref 0.0–1.0)

## 2012-11-16 LAB — HEPATIC FUNCTION PANEL
Alkaline Phosphatase: 70 U/L (ref 39–117)
Bilirubin, Direct: 0.1 mg/dL (ref 0.0–0.3)
Total Bilirubin: 0.4 mg/dL (ref 0.3–1.2)

## 2012-11-16 LAB — BASIC METABOLIC PANEL
BUN: 9 mg/dL (ref 6–23)
CO2: 28 mEq/L (ref 19–32)
Chloride: 105 mEq/L (ref 96–112)
Creatinine, Ser: 1 mg/dL (ref 0.4–1.2)
Glucose, Bld: 94 mg/dL (ref 70–99)
Potassium: 4.2 mEq/L (ref 3.5–5.1)

## 2012-11-16 LAB — LIPID PANEL
Cholesterol: 180 mg/dL (ref 0–200)
LDL Cholesterol: 87 mg/dL (ref 0–99)
Total CHOL/HDL Ratio: 2
VLDL: 15.8 mg/dL (ref 0.0–40.0)

## 2012-11-16 LAB — CBC WITH DIFFERENTIAL/PLATELET
Basophils Relative: 0.5 % (ref 0.0–3.0)
Eosinophils Absolute: 0.1 10*3/uL (ref 0.0–0.7)
Lymphocytes Relative: 45.9 % (ref 12.0–46.0)
MCHC: 32.3 g/dL (ref 30.0–36.0)
MCV: 88.3 fl (ref 78.0–100.0)
Monocytes Absolute: 0.4 10*3/uL (ref 0.1–1.0)
Neutrophils Relative %: 46.2 % (ref 43.0–77.0)
Platelets: 228 10*3/uL (ref 150.0–400.0)
RBC: 4.69 Mil/uL (ref 3.87–5.11)
WBC: 7.1 10*3/uL (ref 4.5–10.5)

## 2012-11-17 ENCOUNTER — Ambulatory Visit: Payer: Federal, State, Local not specified - PPO | Admitting: Internal Medicine

## 2012-11-19 ENCOUNTER — Ambulatory Visit (INDEPENDENT_AMBULATORY_CARE_PROVIDER_SITE_OTHER): Payer: Federal, State, Local not specified - PPO | Admitting: Internal Medicine

## 2012-11-19 ENCOUNTER — Encounter: Payer: Self-pay | Admitting: Internal Medicine

## 2012-11-19 VITALS — BP 110/78 | HR 76 | Temp 97.5°F | Ht 67.0 in | Wt 143.1 lb

## 2012-11-19 DIAGNOSIS — Z0000 Encounter for general adult medical examination without abnormal findings: Secondary | ICD-10-CM

## 2012-11-19 DIAGNOSIS — I10 Essential (primary) hypertension: Secondary | ICD-10-CM

## 2012-11-19 MED ORDER — POTASSIUM CHLORIDE CRYS ER 20 MEQ PO TBCR: 20.0000 meq | EXTENDED_RELEASE_TABLET | Freq: Every day | ORAL | Status: DC

## 2012-11-19 MED ORDER — SPIRONOLACTONE 25 MG PO TABS: 25.0000 mg | ORAL_TABLET | Freq: Every day | ORAL | Status: DC

## 2012-11-19 NOTE — Assessment & Plan Note (Signed)

## 2012-11-19 NOTE — Progress Notes (Signed)
Subjective:    Patient ID: Danielle Williams, female    DOB: November 30, 1961, 51 y.o.   MRN: [id number]  HPI  Here for wellness and f/u;  Overall doing ok;  Pt denies CP, worsening SOB, DOE, wheezing, orthopnea, PND, worsening LE edema, palpitations, dizziness or syncope.  Pt denies neurological change such as new Headache, facial or extremity weakness.  Pt denies polydipsia, polyuria, or low sugar symptoms. Pt states overall good compliance with treatment and medications, good tolerability, and trying to follow lower cholesterol diet.  Pt denies worsening depressive symptoms, suicidal ideation or panic. No fever, wt loss, night sweats, loss of appetite, or other constitutional symptoms.  Pt states good ability with ADL's, low fall risk, home safety reviewed and adequate, no significant changes in hearing or vision, but has been increasingly active with exercise in the last few months,having run several 5K races, and runs with the ladies group twice per wk at the park about 3-4 miles.   Past Medical History  Diagnosis Date  . Anemia     Iron def  . History of hepatitis     Type A  . Recurrent boils     Recurrent  . Vitamin D deficiency    Past Surgical History  Procedure Date  . Abdominal hysterectomy     Due to fibroids    reports that she has been smoking Cigarettes.  She has a 15 pack-year smoking history. She has never used smokeless tobacco. She reports that she drinks alcohol. She reports that she uses illicit drugs. family history includes Alcohol abuse in her cousin; Arthritis in her other; Cancer in her cousin and other; Diabetes in her sister; Heart disease in her other; and Hypertension in her sister. No Known Allergies Current Outpatient Prescriptions on File Prior to Visit  Medication Sig Dispense Refill  . aspirin 81 MG tablet Take 81 mg by mouth daily.      Marland Kitchen ibuprofen (ADVIL,MOTRIN) 400 MG tablet Take 400 mg by mouth every 6 (six) hours as needed.        . [DISCONTINUED] potassium  chloride SA (K-DUR,KLOR-CON) 20 MEQ tablet Take 20 mEq by mouth daily.      . [DISCONTINUED] spironolactone (ALDACTONE) 25 MG tablet Take 1 tablet (25 mg total) by mouth daily.  30 tablet  6   Review of Systems Review of Systems  Constitutional: Negative for diaphoresis, activity change, appetite change and unexpected weight change.  HENT: Negative for hearing loss, ear pain, facial swelling, mouth sores and neck stiffness.   Eyes: Negative for pain, redness and visual disturbance.  Respiratory: Negative for shortness of breath and wheezing.   Cardiovascular: Negative for chest pain and palpitations.  Gastrointestinal: Negative for diarrhea, blood in stool, abdominal distention and rectal pain.  Genitourinary: Negative for hematuria, flank pain and decreased urine volume.  Musculoskeletal: Negative for myalgias and joint swelling.  Skin: Negative for color change and wound.  Neurological: Negative for syncope and numbness.  Hematological: Negative for adenopathy.  Psychiatric/Behavioral: Negative for hallucinations, self-injury, decreased concentration and agitation.      Objective:   Physical Exam BP 110/78  Pulse 76  Temp 97.5 F (36.4 C) (Oral)  Ht 5\' 7"  (1.702 m)  Wt 143 lb 2 oz (64.921 kg)  BMI 22.42 kg/m2  SpO2 99% Physical Exam  VS noted Constitutional: Pt is oriented to person, place, and time. Appears well-developed and well-nourished.  HENT:  Head: Normocephalic and atraumatic.  Right Ear: External ear normal.  Left Ear: External ear  normal.  Nose: Nose normal.  Mouth/Throat: Oropharynx is clear and moist.  Eyes: Conjunctivae and EOM are normal. Pupils are equal, round, and reactive to light.  Neck: Normal range of motion. Neck supple. No JVD present. No tracheal deviation present.  Cardiovascular: Normal rate, regular rhythm, normal heart sounds and intact distal pulses.   Pulmonary/Chest: Effort normal and breath sounds normal.  Abdominal: Soft. Bowel sounds are  normal. There is no tenderness.  Musculoskeletal: Normal range of motion. Exhibits no edema.  Lymphadenopathy:  Has no cervical adenopathy.  Neurological: Pt is alert and oriented to person, place, and time. Pt has normal reflexes. No cranial nerve deficit.  Skin: Skin is warm and dry. No rash noted.  Psychiatric:  Has  normal mood and affect. Behavior is normal.     Assessment & Plan:

## 2012-11-19 NOTE — Patient Instructions (Addendum)
Continue all other medications as before Your medications were refilled today Please continue your efforts at being more active, low cholesterol diet, and weight control. You are otherwise up to date with prevention measures Thank you for enrolling in MyChart. Please follow the instructions below to securely access your online medical record. MyChart allows you to send messages to your doctor, view your test results, renew your prescriptions, schedule appointments, and more. To Log into MyChart, please go to [email or web address], and your Username is:  Danielle Williams You will be contacted regarding the referral for: colonoscopy Please return in 1 year for your yearly visit, or sooner if needed, with Lab testing done 3-5 days before

## 2013-10-21 ENCOUNTER — Other Ambulatory Visit: Payer: Self-pay

## 2013-11-19 ENCOUNTER — Other Ambulatory Visit (INDEPENDENT_AMBULATORY_CARE_PROVIDER_SITE_OTHER): Payer: Federal, State, Local not specified - PPO

## 2013-11-19 DIAGNOSIS — Z0000 Encounter for general adult medical examination without abnormal findings: Secondary | ICD-10-CM

## 2013-11-19 LAB — CBC WITH DIFFERENTIAL/PLATELET
Basophils Absolute: 0 10*3/uL (ref 0.0–0.1)
Eosinophils Relative: 1.6 % (ref 0.0–5.0)
HCT: 41.7 % (ref 36.0–46.0)
Hemoglobin: 13.9 g/dL (ref 12.0–15.0)
Lymphs Abs: 3.2 10*3/uL (ref 0.7–4.0)
MCV: 88.8 fl (ref 78.0–100.0)
Monocytes Absolute: 0.5 10*3/uL (ref 0.1–1.0)
Monocytes Relative: 6.6 % (ref 3.0–12.0)
Neutro Abs: 4 10*3/uL (ref 1.4–7.7)
RDW: 14 % (ref 11.5–14.6)

## 2013-11-19 LAB — TSH: TSH: 5.59 u[IU]/mL — ABNORMAL HIGH (ref 0.35–5.50)

## 2013-11-19 LAB — BASIC METABOLIC PANEL
BUN: 12 mg/dL (ref 6–23)
Chloride: 106 mEq/L (ref 96–112)
Glucose, Bld: 88 mg/dL (ref 70–99)
Potassium: 4.1 mEq/L (ref 3.5–5.1)

## 2013-11-19 LAB — HEPATIC FUNCTION PANEL
ALT: 14 U/L (ref 0–35)
AST: 19 U/L (ref 0–37)
Albumin: 4 g/dL (ref 3.5–5.2)

## 2013-11-19 LAB — URINALYSIS, ROUTINE W REFLEX MICROSCOPIC
Leukocytes, UA: NEGATIVE
Specific Gravity, Urine: 1.025 (ref 1.000–1.030)
Urine Glucose: NEGATIVE
Urobilinogen, UA: 0.2 (ref 0.0–1.0)

## 2013-11-19 LAB — LIPID PANEL: Cholesterol: 177 mg/dL (ref 0–200)

## 2013-11-24 ENCOUNTER — Ambulatory Visit (INDEPENDENT_AMBULATORY_CARE_PROVIDER_SITE_OTHER): Payer: Federal, State, Local not specified - PPO | Admitting: Internal Medicine

## 2013-11-24 ENCOUNTER — Encounter: Payer: Self-pay | Admitting: Internal Medicine

## 2013-11-24 ENCOUNTER — Ambulatory Visit: Payer: Federal, State, Local not specified - PPO | Admitting: Family Medicine

## 2013-11-24 VITALS — BP 110/70 | HR 79 | Temp 98.8°F | Ht 67.0 in | Wt 147.0 lb

## 2013-11-24 DIAGNOSIS — Z0000 Encounter for general adult medical examination without abnormal findings: Secondary | ICD-10-CM

## 2013-11-24 NOTE — Progress Notes (Signed)
Pre-visit discussion using our clinic review tool. No additional management support is needed unless otherwise documented below in the visit note.  

## 2013-11-24 NOTE — Progress Notes (Signed)
Subjective:    Patient ID: Danielle Williams, female    DOB: 13-Feb-1961, 52 y.o.   MRN: [id number]  HPI Here for wellness and f/u;  Overall doing ok;  Pt denies CP, worsening SOB, DOE, wheezing, orthopnea, PND, worsening LE edema, palpitations, dizziness or syncope.  Pt denies neurological change such as new headache, facial or extremity weakness.  Pt denies polydipsia, polyuria, or low sugar symptoms. Pt states overall good compliance with treatment and medications, good tolerability, and has been trying to follow lower cholesterol diet.  Pt denies worsening depressive symptoms, suicidal ideation or panic. No fever, night sweats, wt loss, loss of appetite, or other constitutional symptoms.  Pt states good ability with ADL's, has low fall risk, home safety reviewed and adequate, no other significant changes in hearing or vision, and only occasionally active with exercise, had to slow down on running due to right knee pain and swelling, saw [facility]  - s/p cortisone shot,didnt like the shot, seemed worse after, did better with time and a knee brace and wont go back to the PA.  Gained 6 lbs since July 2013.  Wants to get back to running if possible. Past Medical History  Diagnosis Date  . Anemia     Iron def  . History of hepatitis     Type A  . Recurrent boils     Recurrent  . Vitamin D deficiency    Past Surgical History  Procedure Laterality Date  . Abdominal hysterectomy      Due to fibroids    reports that she has been smoking Cigarettes.  She has a 15 pack-year smoking history. She has never used smokeless tobacco. She reports that she drinks alcohol. She reports that she uses illicit drugs. family history includes Alcohol abuse in her cousin; Arthritis in her other; Cancer in her cousin and other; Diabetes in her sister; Heart disease in her other; Hypertension in her sister. No Known Allergies Current Outpatient Prescriptions on File Prior to Visit  Medication Sig Dispense  Refill  . aspirin 81 MG tablet Take 81 mg by mouth daily.      Marland Kitchen ibuprofen (ADVIL,MOTRIN) 400 MG tablet Take 400 mg by mouth every 6 (six) hours as needed.        . potassium chloride SA (K-DUR,KLOR-CON) 20 MEQ tablet Take 1 tablet (20 mEq total) by mouth daily.  90 tablet  3  . spironolactone (ALDACTONE) 25 MG tablet Take 1 tablet (25 mg total) by mouth daily.  90 tablet  3   No current facility-administered medications on file prior to visit.    Review of Systems Constitutional: Negative for diaphoresis, activity change, appetite change or unexpected weight change.  HENT: Negative for hearing loss, ear pain, facial swelling, mouth sores and neck stiffness.   Eyes: Negative for pain, redness and visual disturbance.  Respiratory: Negative for shortness of breath and wheezing.   Cardiovascular: Negative for chest pain and palpitations.  Gastrointestinal: Negative for diarrhea, blood in stool, abdominal distention or other pain Genitourinary: Negative for hematuria, flank pain or change in urine volume.  Musculoskeletal: Negative for myalgias and joint swelling.  Skin: Negative for color change and wound.  Neurological: Negative for syncope and numbness. other than noted Hematological: Negative for adenopathy.  Psychiatric/Behavioral: Negative for hallucinations, self-injury, decreased concentration and agitation.      Objective:   Physical Exam BP 110/70  Pulse 79  Temp(Src) 98.8 F (37.1 C) (Oral)  Ht 5\' 7"  (1.702 m)  Wt  147 lb (66.679 kg)  BMI 23.02 kg/m2  SpO2 97% VS noted,  Constitutional: Pt is oriented to person, place, and time. Appears well-developed and well-nourished.  Head: Normocephalic and atraumatic.  Right Ear: External ear normal.  Left Ear: External ear normal.  Nose: Nose normal.  Mouth/Throat: Oropharynx is clear and moist.  Eyes: Conjunctivae and EOM are normal. Pupils are equal, round, and reactive to light.  Neck: Normal range of motion. Neck supple. No  JVD present. No tracheal deviation present.  Cardiovascular: Normal rate, regular rhythm, normal heart sounds and intact distal pulses.   Pulmonary/Chest: Effort normal and breath sounds normal.  Abdominal: Soft. Bowel sounds are normal. There is no tenderness. No HSM  Musculoskeletal: Normal range of motion. Exhibits no edema.  Lymphadenopathy:  Has no cervical adenopathy.  Neurological: Pt is alert and oriented to person, place, and time. Pt has normal reflexes. No cranial nerve deficit.  Skin: Skin is warm and dry. No rash noted.  Psychiatric:  Has  normal mood and affect. Behavior is normal.     Assessment & Plan:

## 2013-11-24 NOTE — Patient Instructions (Addendum)
Please continue all other medications as before, and refills have been done if requested. Please have the pharmacy call with any other refills you may need. Please continue your efforts at being more active, low cholesterol diet, and weight control. You are otherwise up to date with prevention measures today. Please keep your appointments with your specialists as you may have planned No further lab work needed today Your EKG was ok today  You will be contacted regarding the referral for: colonoscopy, AND Dr Katrinka Blazing (you have an appt at [phone] today, but this can be changed as you leave if you like)  Please remember to sign up for My Chart if you have not done so, as this will be important to you in the future with finding out test results, communicating by private email, and scheduling acute appointments online when needed.  Please return in 1 year for your yearly visit, or sooner if needed, with Lab testing done 3-5 days before

## 2013-11-24 NOTE — Assessment & Plan Note (Signed)

## 2013-11-25 ENCOUNTER — Other Ambulatory Visit: Payer: Self-pay | Admitting: Internal Medicine

## 2013-11-30 ENCOUNTER — Ambulatory Visit: Payer: Federal, State, Local not specified - PPO | Admitting: Family Medicine

## 2013-12-29 ENCOUNTER — Other Ambulatory Visit: Payer: Self-pay | Admitting: Internal Medicine

## 2014-01-07 ENCOUNTER — Encounter: Payer: Self-pay | Admitting: Internal Medicine

## 2014-01-21 ENCOUNTER — Ambulatory Visit (INDEPENDENT_AMBULATORY_CARE_PROVIDER_SITE_OTHER): Payer: Federal, State, Local not specified - PPO | Admitting: Cardiology

## 2014-01-21 ENCOUNTER — Encounter: Payer: Self-pay | Admitting: Cardiology

## 2014-01-21 VITALS — BP 140/90 | HR 72 | Ht 67.0 in | Wt 149.0 lb

## 2014-01-21 DIAGNOSIS — I10 Essential (primary) hypertension: Secondary | ICD-10-CM

## 2014-01-21 NOTE — Patient Instructions (Signed)
Your physician recommends that you continue on your current medications as directed. Please refer to the Current Medication list given to you today.  Return in August for doppler, we will send you a letter

## 2014-01-21 NOTE — Progress Notes (Signed)
   HPI The patient presents for evaluation of HTN.  Since I last saw her she has done well.  The patient denies any new symptoms such as chest discomfort, neck or arm discomfort. There has been no new shortness of breath, PND or orthopnea. There have been no reported palpitations, presyncope or syncope. She is working out in hopes to run a half marathon at [employer] this summer. She still smokes a cigarette every morning.  No Known Allergies  Current Outpatient Prescriptions  Medication Sig Dispense Refill  . aspirin 81 MG tablet Take 81 mg by mouth daily.      Marland Kitchen ibuprofen (ADVIL,MOTRIN) 400 MG tablet Take 400 mg by mouth every 6 (six) hours as needed.        Marland Kitchen KLOR-CON M20 20 MEQ tablet TAKE 1 TABLET BY MOUTH DAILY.  90 tablet  3  . Probiotic Product (PROBIOTIC DAILY PO) Take by mouth daily.      Marland Kitchen spironolactone (ALDACTONE) 25 MG tablet TAKE 1 TABLET BY MOUTH DAILY  90 tablet  3   No current facility-administered medications for this visit.    Past Medical History  Diagnosis Date  . Anemia     Iron def  . History of hepatitis     Type A  . Recurrent boils     Recurrent  . Vitamin D deficiency     Past Surgical History  Procedure Laterality Date  . Abdominal hysterectomy      Due to fibroids   ROS: As stated in the HPI and negative for all other systems.  PHYSICAL EXAM BP 140/90  Pulse 72  Ht 5\' 7"  (1.702 m)  Wt 149 lb (67.586 kg)  BMI 23.33 kg/m2 GENERAL:  Well appearing NECK:  No jugular venous distention, waveform within normal limits, carotid upstroke brisk and symmetric, no bruits, no thyromegaly LYMPHATICS:  No cervical, inguinal adenopathy LUNGS:  Clear to auscultation bilaterally CHEST:  Tenderness to palpation, right upper chest HEART:  PMI not displaced or sustained,S1 and S2 within normal limits, no S3, no S4, no clicks, no rubs, no murmurs ABD:  Flat, positive bowel sounds normal in frequency in pitch, no bruits, no rebound, no guarding, no midline  pulsatile mass, no hepatomegaly, no splenomegaly EXT:  2 plus pulses throughout, no edema, no cyanosis no clubbing  ASSESSMENT AND PLAN  HTN (hypertension) - Her blood pressure is well controlled. She will remain on the meds as listed.  Tobacco abuse -  She still smokes a cigarette with her coffee in the morning. We have discussed the need for complete abstinence.  Carotid artery stenosis -  She had 0 - 39% bilateral plaque. I will follow this up August of this year.

## 2014-02-28 ENCOUNTER — Encounter: Payer: Self-pay | Admitting: *Deleted

## 2014-11-22 ENCOUNTER — Other Ambulatory Visit: Payer: Self-pay | Admitting: Internal Medicine

## 2014-11-24 ENCOUNTER — Other Ambulatory Visit (INDEPENDENT_AMBULATORY_CARE_PROVIDER_SITE_OTHER): Payer: Federal, State, Local not specified - PPO

## 2014-11-24 DIAGNOSIS — Z0000 Encounter for general adult medical examination without abnormal findings: Secondary | ICD-10-CM

## 2014-11-24 LAB — CBC WITH DIFFERENTIAL/PLATELET
BASOS PCT: 0.6 % (ref 0.0–3.0)
Basophils Absolute: 0 10*3/uL (ref 0.0–0.1)
EOS ABS: 0.1 10*3/uL (ref 0.0–0.7)
Eosinophils Relative: 1.1 % (ref 0.0–5.0)
HEMATOCRIT: 44.8 % (ref 36.0–46.0)
HEMOGLOBIN: 14.3 g/dL (ref 12.0–15.0)
LYMPHS ABS: 2.6 10*3/uL (ref 0.7–4.0)
LYMPHS PCT: 37.7 % (ref 12.0–46.0)
MCHC: 31.9 g/dL (ref 30.0–36.0)
MCV: 91.3 fl (ref 78.0–100.0)
Monocytes Absolute: 0.4 10*3/uL (ref 0.1–1.0)
Monocytes Relative: 5.8 % (ref 3.0–12.0)
NEUTROS ABS: 3.8 10*3/uL (ref 1.4–7.7)
Neutrophils Relative %: 54.8 % (ref 43.0–77.0)
Platelets: 231 10*3/uL (ref 150.0–400.0)
RBC: 4.91 Mil/uL (ref 3.87–5.11)
RDW: 14.1 % (ref 11.5–15.5)
WBC: 7 10*3/uL (ref 4.0–10.5)

## 2014-11-24 LAB — LIPID PANEL
Cholesterol: 188 mg/dL (ref 0–200)
HDL: 91.3 mg/dL (ref >39.00)
LDL Cholesterol: 85 mg/dL (ref 0–99)
NonHDL: 96.7
TRIGLYCERIDES: 59 mg/dL (ref 0.0–149.0)
Total CHOL/HDL Ratio: 2
VLDL: 11.8 mg/dL (ref 0.0–40.0)

## 2014-11-24 LAB — URINALYSIS, ROUTINE W REFLEX MICROSCOPIC
Bilirubin Urine: NEGATIVE
Ketones, ur: NEGATIVE
LEUKOCYTES UA: NEGATIVE
NITRITE: NEGATIVE
PH: 5 (ref 5.0–8.0)
Total Protein, Urine: 100 — AB
Urine Glucose: NEGATIVE
Urobilinogen, UA: 0.2 (ref 0.0–1.0)

## 2014-11-24 LAB — HEPATIC FUNCTION PANEL
ALK PHOS: 66 U/L (ref 39–117)
ALT: 13 U/L (ref 0–35)
AST: 18 U/L (ref 0–37)
Albumin: 4.3 g/dL (ref 3.5–5.2)
Bilirubin, Direct: 0.1 mg/dL (ref 0.0–0.3)
TOTAL PROTEIN: 7.3 g/dL (ref 6.0–8.3)
Total Bilirubin: 0.5 mg/dL (ref 0.2–1.2)

## 2014-11-24 LAB — BASIC METABOLIC PANEL
BUN: 10 mg/dL (ref 6–23)
CALCIUM: 9.3 mg/dL (ref 8.4–10.5)
CO2: 26 mEq/L (ref 19–32)
CREATININE: 0.9 mg/dL (ref 0.4–1.2)
Chloride: 104 mEq/L (ref 96–112)
GFR: 82 mL/min (ref >60.00)
Glucose, Bld: 99 mg/dL (ref 70–99)
Potassium: 3.8 mEq/L (ref 3.5–5.1)
SODIUM: 138 meq/L (ref 135–145)

## 2014-11-24 LAB — TSH: TSH: 4.68 u[IU]/mL — ABNORMAL HIGH (ref 0.35–4.50)

## 2014-11-25 ENCOUNTER — Ambulatory Visit (INDEPENDENT_AMBULATORY_CARE_PROVIDER_SITE_OTHER): Payer: Federal, State, Local not specified - PPO | Admitting: Internal Medicine

## 2014-11-25 ENCOUNTER — Encounter: Payer: Self-pay | Admitting: Internal Medicine

## 2014-11-25 VITALS — BP 128/82 | HR 76 | Temp 98.1°F | Ht 67.0 in | Wt 132.2 lb

## 2014-11-25 DIAGNOSIS — Z0000 Encounter for general adult medical examination without abnormal findings: Secondary | ICD-10-CM

## 2014-11-25 NOTE — Assessment & Plan Note (Signed)

## 2014-11-25 NOTE — Progress Notes (Signed)
Subjective:    Patient ID: Danielle Williams, female    DOB: 1961-01-28, 53 y.o.   MRN: [id number]  HPI  Here for wellness and f/u;  Overall doing ok;  Pt denies CP, worsening SOB, DOE, wheezing, orthopnea, PND, worsening LE edema, palpitations, dizziness or syncope.  Pt denies neurological change such as new headache, facial or extremity weakness.  Pt denies polydipsia, polyuria, or low sugar symptoms. Pt states overall good compliance with treatment and medications, good tolerability, and has been trying to follow lower cholesterol diet.  Pt denies worsening depressive symptoms, suicidal ideation or panic. No fever, night sweats, wt loss, loss of appetite, or other constitutional symptoms.  Pt states good ability with ADL's, has low fall risk, home safety reviewed and adequate, no other significant changes in hearing or vision, and only occasionally active with exercise.  Here with hair thinning on crown recently, plans to see dermatology. Friend sees Dr Allyson Sabal, plans to call herself. Several deaths in church family recently, feeling sad recently, also a good friends mother died recently. Still smoking, did not tolerate chantix.  Due for colonscopy Past Medical History  Diagnosis Date  . Anemia     Iron def  . History of hepatitis     Type A  . Recurrent boils     Recurrent  . Vitamin D deficiency    Past Surgical History  Procedure Laterality Date  . Abdominal hysterectomy      Due to fibroids    reports that she has been smoking Cigarettes.  She has a 15 pack-year smoking history. She has never used smokeless tobacco. She reports that she drinks alcohol. She reports that she uses illicit drugs. family history includes Alcohol abuse in her cousin; Arthritis in her other; Cancer in her cousin and other; Diabetes in her sister; Heart disease in her other; Hypertension in her sister. No Known Allergies Current Outpatient Prescriptions on File Prior to Visit  Medication Sig Dispense Refill  .  aspirin 81 MG tablet Take 81 mg by mouth daily.    Marland Kitchen KLOR-CON M20 20 MEQ tablet TAKE 1 TABLET BY MOUTH DAILY. 90 tablet 3  . Probiotic Product (PROBIOTIC DAILY PO) Take by mouth daily.    Marland Kitchen spironolactone (ALDACTONE) 25 MG tablet TAKE 1 TABLET BY MOUTH DAILY 90 tablet 0   No current facility-administered medications on file prior to visit.    Review of Systems Constitutional: Negative for increased diaphoresis, other activity, appetite or other siginficant weight change  HENT: Negative for worsening hearing loss, ear pain, facial swelling, mouth sores and neck stiffness.   Eyes: Negative for other worsening pain, redness or visual disturbance.  Respiratory: Negative for shortness of breath and wheezing.   Cardiovascular: Negative for chest pain and palpitations.  Gastrointestinal: Negative for diarrhea, blood in stool, abdominal distention or other pain Genitourinary: Negative for hematuria, flank pain or change in urine volume.  Musculoskeletal: Negative for myalgias or other joint complaints.  Skin: Negative for color change and wound.  Neurological: Negative for syncope and numbness. other than noted Hematological: Negative for adenopathy. or other swelling Psychiatric/Behavioral: Negative for hallucinations, self-injury, decreased concentration or other worsening agitation.      Objective:   Physical Exam BP 128/82 mmHg  Pulse 76  Temp(Src) 98.1 F (36.7 C) (Oral)  Ht 5\' 7"  (1.702 m)  Wt 132 lb 4 oz (59.988 kg)  BMI 20.71 kg/m2  SpO2 97% VS noted,  Constitutional: Pt is oriented to person, place, and time. Appears well-developed  and well-nourished.  Head: Normocephalic and atraumatic.  Right Ear: External ear normal.  Left Ear: External ear normal.  Nose: Nose normal.  Mouth/Throat: Oropharynx is clear and moist.  Eyes: Conjunctivae and EOM are normal. Pupils are equal, round, and reactive to light.  Neck: Normal range of motion. Neck supple. No JVD present. No tracheal  deviation present.  Cardiovascular: Normal rate, regular rhythm, normal heart sounds and intact distal pulses.   Pulmonary/Chest: Effort normal and breath sounds without rales or wheezing  Abdominal: Soft. Bowel sounds are normal. NT. No HSM  Musculoskeletal: Normal range of motion. Exhibits no edema.  Lymphadenopathy:  Has no cervical adenopathy.  Neurological: Pt is alert and oriented to person, place, and time. Pt has normal reflexes. No cranial nerve deficit. Motor grossly intact Skin: Skin is warm and dry. No rash noted.  Psychiatric:  Has normal mood and affect. Behavior is normal.     Assessment & Plan:

## 2014-11-25 NOTE — Progress Notes (Signed)
Pre visit review using our clinic review tool, if applicable. No additional management support is needed unless otherwise documented below in the visit note. 

## 2014-11-25 NOTE — Patient Instructions (Addendum)
Your EKG was OK today  Please continue all other medications as before, and refills have been done if requested.  Please have the pharmacy call with any other refills you may need.  Please continue your efforts at being more active, low cholesterol diet, and weight control.  You are otherwise up to date with prevention measures today.  Please keep your appointments with your specialists as you may have planned - cardiology  You will be contacted regarding the referral for: colonoscopy  Please stop smoking  Please return in 1 year for your yearly visit, or sooner if needed, with Lab testing done 3-5 days before

## 2014-11-28 ENCOUNTER — Telehealth: Payer: Self-pay | Admitting: Internal Medicine

## 2014-11-28 NOTE — Telephone Encounter (Signed)
emmi emailed °

## 2015-01-05 ENCOUNTER — Telehealth: Payer: Self-pay | Admitting: Internal Medicine

## 2015-01-05 MED ORDER — POTASSIUM CHLORIDE CRYS ER 20 MEQ PO TBCR: 20.0000 meq | EXTENDED_RELEASE_TABLET | Freq: Every day | ORAL | Status: DC

## 2015-01-05 MED ORDER — SPIRONOLACTONE 25 MG PO TABS: 25.0000 mg | ORAL_TABLET | Freq: Every day | ORAL | Status: DC

## 2015-01-05 NOTE — Telephone Encounter (Signed)
refils done and pt informed.

## 2015-01-05 NOTE — Telephone Encounter (Signed)
Pt called in and needs refill her Potassium and blood pressure meds

## 2015-01-23 ENCOUNTER — Ambulatory Visit (AMBULATORY_SURGERY_CENTER): Payer: Self-pay | Admitting: *Deleted

## 2015-01-23 VITALS — Ht 67.0 in | Wt 137.2 lb

## 2015-01-23 DIAGNOSIS — Z1211 Encounter for screening for malignant neoplasm of colon: Secondary | ICD-10-CM

## 2015-01-23 MED ORDER — MOVIPREP 100 G PO SOLR: 1.0000 | Freq: Once | ORAL | Status: DC

## 2015-01-23 NOTE — Progress Notes (Signed)
No egg or soy allergy No diet pills No home 02 use No issues with past sedation emmi video to email

## 2015-02-08 ENCOUNTER — Ambulatory Visit (AMBULATORY_SURGERY_CENTER): Payer: Federal, State, Local not specified - PPO | Admitting: Internal Medicine

## 2015-02-08 ENCOUNTER — Encounter: Payer: Self-pay | Admitting: Internal Medicine

## 2015-02-08 VITALS — BP 118/86 | HR 67 | Temp 96.8°F | Resp 15 | Ht 67.0 in | Wt 137.0 lb

## 2015-02-08 DIAGNOSIS — D128 Benign neoplasm of rectum: Secondary | ICD-10-CM

## 2015-02-08 DIAGNOSIS — D122 Benign neoplasm of ascending colon: Secondary | ICD-10-CM

## 2015-02-08 DIAGNOSIS — Z1211 Encounter for screening for malignant neoplasm of colon: Secondary | ICD-10-CM

## 2015-02-08 HISTORY — PX: COLONOSCOPY: SHX174

## 2015-02-08 MED ORDER — SODIUM CHLORIDE 0.9 % IV SOLN: 500.0000 mL | INTRAVENOUS | Status: DC

## 2015-02-08 NOTE — Op Note (Signed)
[facility]  [address]. [place], [address]   COLONOSCOPY PROCEDURE REPORT  PATIENT: Danielle Williams, Danielle Williams  MR#: [id number] BIRTHDATE: 08-Jun-1961 , 49  yrs. old GENDER: female ENDOSCOPIST: Jerene Bears, MD REFERRED [id number] John, M.D. PROCEDURE DATE:  02/08/2015 PROCEDURE:   Colonoscopy with cold biopsy polypectomy First Screening Colonoscopy - Avg.  risk and is 50 yrs.  old or older Yes.  Prior Negative Screening - Now for repeat screening. N/A  History of Adenoma - Now for follow-up colonoscopy & has been > or = to 3 yrs.  N/A  Polyps Removed Today? Yes. ASA CLASS:   Class II INDICATIONS:average risk patient for colon cancer and 1st colonoscopy. MEDICATIONS: Monitored anesthesia care and Propofol 200 mg IV  DESCRIPTION OF PROCEDURE:   After the risks benefits and alternatives of the procedure were thoroughly explained, informed consent was obtained.  The digital rectal exam revealed no rectal mass.   The LB [id number]  endoscope was introduced through the anus and advanced to the cecum, which was identified by both the appendix and ileocecal valve. No adverse events experienced. The quality of the prep was good, using MoviPrep  The instrument was then slowly withdrawn as the colon was fully examined.  COLON FINDINGS: Two sessile polyps ranging from 3 to 52mm in size were found in the ascending colon and rectum.  Polypectomies were performed with cold forceps.  The resection was complete, the polyp tissue was completely retrieved and sent to histology.   The examination was otherwise normal.  Retroflexed views revealed no abnormalities. The time to cecum=4 minutes 15 seconds.  Withdrawal time=10 minutes 29 seconds.  The scope was withdrawn and the procedure completed.  COMPLICATIONS: There were no immediate complications.  ENDOSCOPIC IMPRESSION: 1.   Two sessile polyps ranging from 3 to 72mm in size were found in the ascending colon and rectum;  polypectomies were performed with cold forceps 2.   The examination was otherwise normal  RECOMMENDATIONS: 1.  Await pathology results 2.  If the polyps removed today are proven to be adenomatous (pre-cancerous) polyps, you will need a repeat colonoscopy in 5 years.  Otherwise you should continue to follow colorectal cancer screening guidelines for "routine risk" patients with colonoscopy in 10 years.  You will receive a letter within 1-2 weeks with the results of your biopsy as well as final recommendations.  Please call my office if you have not received a letter after 3 weeks.  eSigned:  Jerene Bears, MD 02/08/2015 8:37 AM   cc: Biagio Borg, MD and The Patient

## 2015-02-08 NOTE — Progress Notes (Signed)
A/ox3 pleased with MAC, report to Wendy RN 

## 2015-02-08 NOTE — Progress Notes (Signed)
Called to room to assist during endoscopic procedure.  Patient ID and intended procedure confirmed with present staff. Received instructions for my participation in the procedure from the performing physician.  

## 2015-02-08 NOTE — Patient Instructions (Signed)
YOU HAD AN ENDOSCOPIC PROCEDURE TODAY AT THE [facility]: Refer to the procedure report that was given to you for any specific questions about what was found during the examination.  If the procedure report does not answer your questions, please call your gastroenterologist to clarify.  If you requested that your care partner not be given the details of your procedure findings, then the procedure report has been included in a sealed envelope for you to review at your convenience later.  YOU SHOULD EXPECT: Some feelings of bloating in the abdomen. Passage of more gas than usual.  Walking can help get rid of the air that was put into your GI tract during the procedure and reduce the bloating. If you had a lower endoscopy (such as a colonoscopy or flexible sigmoidoscopy) you may notice spotting of blood in your stool or on the toilet paper. If you underwent a bowel prep for your procedure, then you may not have a normal bowel movement for a few days.  DIET: Your first meal following the procedure should be a light meal and then it is ok to progress to your normal diet.  A half-sandwich or bowl of soup is an example of a good first meal.  Heavy or fried foods are harder to digest and may make you feel nauseous or bloated.  Likewise meals heavy in dairy and vegetables can cause extra gas to form and this can also increase the bloating.  Drink plenty of fluids but you should avoid alcoholic beverages for 24 hours.  ACTIVITY: Your care partner should take you home directly after the procedure.  You should plan to take it easy, moving slowly for the rest of the day.  You can resume normal activity the day after the procedure however you should NOT DRIVE or use heavy machinery for 24 hours (because of the sedation medicines used during the test).    SYMPTOMS TO REPORT IMMEDIATELY: A gastroenterologist can be reached at any hour.  During normal business hours, 8:30 AM to 5:00 PM Monday through Friday,  call [phone].  After hours and on weekends, please call the GI answering service at [phone] who will take a message and have the physician on call contact you.   Following lower endoscopy (colonoscopy or flexible sigmoidoscopy):  Excessive amounts of blood in the stool  Significant tenderness or worsening of abdominal pains  Swelling of the abdomen that is new, acute  Fever of 100F or higher  FOLLOW UP: If any biopsies were taken you will be contacted by phone or by letter within the next 1-3 weeks.  Call your gastroenterologist if you have not heard about the biopsies in 3 weeks.  Our staff will call the home number listed on your records the next business day following your procedure to check on you and address any questions or concerns that you may have at that time regarding the information given to you following your procedure. This is a courtesy call and so if there is no answer at the home number and we have not heard from you through the emergency physician on call, we will assume that you have returned to your regular daily activities without incident.  SIGNATURES/CONFIDENTIALITY: You and/or your care partner have signed paperwork which will be entered into your electronic medical record.  These signatures attest to the fact that that the information above on your After Visit Summary has been reviewed and is understood.  Full responsibility of the confidentiality of this   discharge information lies with you and/or your care-partner.  Recommendations Next colonoscopy determined by pathology results; 5 or 10 years. Discharge instructions given to patient and/or care partner. Polyp handout provided.

## 2015-02-09 ENCOUNTER — Telehealth: Payer: Self-pay | Admitting: *Deleted

## 2015-02-09 NOTE — Telephone Encounter (Signed)
Left message on f/u callback 

## 2015-02-13 ENCOUNTER — Encounter: Payer: Self-pay | Admitting: Internal Medicine

## 2015-02-21 ENCOUNTER — Other Ambulatory Visit: Payer: Self-pay | Admitting: Internal Medicine

## 2015-05-04 ENCOUNTER — Encounter: Payer: Self-pay | Admitting: Cardiology

## 2015-05-04 ENCOUNTER — Ambulatory Visit (INDEPENDENT_AMBULATORY_CARE_PROVIDER_SITE_OTHER): Payer: Federal, State, Local not specified - PPO | Admitting: Cardiology

## 2015-05-04 VITALS — BP 130/92 | HR 79 | Ht 67.0 in | Wt 134.0 lb

## 2015-05-04 DIAGNOSIS — I10 Essential (primary) hypertension: Secondary | ICD-10-CM

## 2015-05-04 DIAGNOSIS — R42 Dizziness and giddiness: Secondary | ICD-10-CM

## 2015-05-04 DIAGNOSIS — Z720 Tobacco use: Secondary | ICD-10-CM | POA: Diagnosis not present

## 2015-05-04 NOTE — Patient Instructions (Signed)
Your physician recommends that you schedule a follow-up appointment in: one year with Dr. Hochrein  

## 2015-05-04 NOTE — Progress Notes (Signed)
   HPI The patient presents for evaluation of HTN.  Since I last saw her she has done well.  The patient denies any new symptoms such as chest discomfort, neck or arm discomfort. There has been no new shortness of breath, PND or orthopnea. She still smokes a cigarette every morning.  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  No Known Allergies  Current Outpatient Prescriptions  Medication Sig Dispense Refill  . Biotin 10 MG TABS Take 1,000 mg by mouth daily.    . diclofenac (VOLTAREN) 50 MG EC tablet Take 50 mg by mouth 2 (two) times daily with a meal.  0  . FLUOCINOLONE ACETONIDE SCALP 0.01 % OIL Apply 1 application topically daily as needed.  2  . potassium chloride SA (KLOR-CON M20) 20 MEQ tablet Take 1 tablet (20 mEq total) by mouth daily. 90 tablet 3  . Probiotic Product (PROBIOTIC DAILY PO) Take by mouth daily.    Marland Kitchen spironolactone (ALDACTONE) 25 MG tablet Take 1 tablet (25 mg total) by mouth daily. 90 tablet 3   No current facility-administered medications for this visit.    Past Medical History  Diagnosis Date  . Anemia     Iron def  . History of hepatitis     Type A  . Recurrent boils     Recurrent  . Vitamin D deficiency   . Hypertension     Past Surgical History  Procedure Laterality Date  . Abdominal hysterectomy      Due to fibroids   ROS: As stated in the HPI and negative for all other systems.  PHYSICAL EXAM BP 130/92 mmHg  Pulse 79  Ht 5\' 7"  (1.702 m)  Wt 134 lb (60.782 kg)  BMI 20.98 kg/m2 GENERAL:  Well appearing NECK:  No jugular venous distention, waveform within normal limits, carotid upstroke brisk and symmetric, no bruits, no thyromegaly LYMPHATICS:  No cervical, inguinal adenopathy LUNGS:  Clear to auscultation bilaterally CHEST:  Tenderness to palpation, right upper chest HEART:  PMI not displaced or sustained,S1 and S2 within normal limits, no S3, no S4, no clicks, no rubs,  no murmurs ABD:  Flat, positive bowel sounds normal in frequency in pitch, no bruits, no rebound, no guarding, no midline pulsatile mass, no hepatomegaly, no splenomegaly EXT:  2 plus pulses throughout, no edema, no cyanosis no clubbing  EKG:  Sinus rhythm, rate 79, left axis deviation, incomplete right bundle branch block, no acute ST-T wave changes.  05/04/2015   ASSESSMENT AND PLAN  HTN (hypertension) - Her blood pressure is well controlled. She will remain on the meds as listed.  Her labs were checked recently.    Tobacco abuse -  She still smokes a cigarette with her coffee in the morning. We have discussed the need for complete abstinence.  Carotid artery stenosis -  She had 0 - 39% bilateral plaque. I will order this again after I see her next year.

## 2015-08-26 ENCOUNTER — Ambulatory Visit (INDEPENDENT_AMBULATORY_CARE_PROVIDER_SITE_OTHER): Payer: Federal, State, Local not specified - PPO | Admitting: Internal Medicine

## 2015-08-26 ENCOUNTER — Encounter: Payer: Self-pay | Admitting: Internal Medicine

## 2015-08-26 VITALS — BP 140/92 | HR 87 | Temp 97.8°F | Resp 12 | Ht 67.0 in | Wt 132.2 lb

## 2015-08-26 DIAGNOSIS — J209 Acute bronchitis, unspecified: Secondary | ICD-10-CM

## 2015-08-26 MED ORDER — DOXYCYCLINE HYCLATE 100 MG PO TABS: 100.0000 mg | ORAL_TABLET | Freq: Two times a day (BID) | ORAL | Status: DC

## 2015-08-26 MED ORDER — BENZONATATE 200 MG PO CAPS: 200.0000 mg | ORAL_CAPSULE | Freq: Three times a day (TID) | ORAL | Status: DC | PRN

## 2015-08-26 NOTE — Progress Notes (Signed)
Pre visit review using our clinic review tool, if applicable. No additional management support is needed unless otherwise documented below in the visit note. 

## 2015-08-26 NOTE — Assessment & Plan Note (Signed)
At this point, likely bacterial infection Discussed cigarette cessation Will treat with benzonatate and doxy

## 2015-08-26 NOTE — Progress Notes (Signed)
Subjective:    Patient ID: Danielle Williams, female    DOB: 1961/03/11, 54 y.o.   MRN: [id number]  HPI Here due to cough  Felt her chest warm and cough about 8 days ago Travelled out of town and tried cough meds Cough has persisted with lots of phlegm Feels very tired--had to miss work yesterday  Thick yellow brown sputum at first-- now less yellow Did start with head congestion--still with frontal headache No clear PND May have had fever at first No chills--- still hot flashes and no clear change Has had some SOB Still smokes at times--counseled  Tried immune boosters and cough meds (OTC)--no clear help  Current Outpatient Prescriptions on File Prior to Visit  Medication Sig Dispense Refill  . Biotin 10 MG TABS Take 1,000 mg by mouth daily.    . diclofenac (VOLTAREN) 50 MG EC tablet Take 50 mg by mouth 2 (two) times daily with a meal.  0  . FLUOCINOLONE ACETONIDE SCALP 0.01 % OIL Apply 1 application topically daily as needed.  2  . potassium chloride SA (KLOR-CON M20) 20 MEQ tablet Take 1 tablet (20 mEq total) by mouth daily. 90 tablet 3  . spironolactone (ALDACTONE) 25 MG tablet Take 1 tablet (25 mg total) by mouth daily. 90 tablet 3   No current facility-administered medications on file prior to visit.    No Known Allergies  Past Medical History  Diagnosis Date  . Anemia     Iron def  . History of hepatitis     Type A  . Recurrent boils     Recurrent  . Vitamin D deficiency   . Hypertension     Past Surgical History  Procedure Laterality Date  . Abdominal hysterectomy      Due to fibroids    Family History  Problem Relation Age of Onset  . Hypertension Sister   . Diabetes Sister   . Arthritis Other     Parent  . Cancer Other     Breast and ovary-sister and cousin  . Heart disease Other     Aunt  . Cancer Other     Brain-nephew   . Alcohol abuse Cousin     Multiple  . Breast cancer Cousin   . Ovarian cancer Sister   . Throat cancer Paternal Aunt    . Dementia Mother     age 61 now   . Other Father     muscle disease /black lung   . Colon cancer Neg Hx   . Esophageal cancer Neg Hx   . Rectal cancer Neg Hx   . Stomach cancer Neg Hx     Social History   Social History  . Marital Status: Divorced    Spouse Name: N/A  . Number of Children: N/A  . Years of Education: N/A   Occupational History  . Not on file.   Social History Main Topics  . Smoking status: Current Some Day Smoker -- 0.50 packs/day for 30 years    Types: Cigarettes  . Smokeless tobacco: Never Used     Comment: She is trying to taper.    . Alcohol Use: 0.0 oz/week    0 Standard drinks or equivalent per week     Comment: socially  . Drug Use: No     Comment: past hx   . Sexual Activity: Not on file   Other Topics Concern  . Not on file   Social History Narrative   Review of Systems No  rash Some diarrhea--AM frequent stools once (sister who lives with her had GI bug) Appetite is off some    Objective:   Physical Exam  Constitutional: She appears well-developed and well-nourished. No distress.  freq coarse cough  HENT:  Mouth/Throat: Oropharynx is clear and moist. No oropharyngeal exudate.  No sinus tenderness TMs okay Mild nasal congestion  Neck: Normal range of motion. Neck supple. No thyromegaly present.  Pulmonary/Chest: Effort normal. No respiratory distress. She has no wheezes. She has no rales.  Slightly decreased breath sounds but clear  Lymphadenopathy:    She has no cervical adenopathy.          Assessment & Plan:

## 2015-11-21 ENCOUNTER — Other Ambulatory Visit (INDEPENDENT_AMBULATORY_CARE_PROVIDER_SITE_OTHER): Payer: Federal, State, Local not specified - PPO

## 2015-11-21 ENCOUNTER — Encounter: Payer: Federal, State, Local not specified - PPO | Admitting: Internal Medicine

## 2015-11-21 DIAGNOSIS — Z0000 Encounter for general adult medical examination without abnormal findings: Secondary | ICD-10-CM

## 2015-11-21 LAB — CBC WITH DIFFERENTIAL/PLATELET
BASOS ABS: 0 10*3/uL (ref 0.0–0.1)
Basophils Relative: 0.4 % (ref 0.0–3.0)
EOS ABS: 0.1 10*3/uL (ref 0.0–0.7)
Eosinophils Relative: 1.6 % (ref 0.0–5.0)
HCT: 43.4 % (ref 36.0–46.0)
Hemoglobin: 14.1 g/dL (ref 12.0–15.0)
LYMPHS ABS: 3.3 10*3/uL (ref 0.7–4.0)
Lymphocytes Relative: 43.1 % (ref 12.0–46.0)
MCHC: 32.5 g/dL (ref 30.0–36.0)
MCV: 91.4 fl (ref 78.0–100.0)
MONOS PCT: 6.1 % (ref 3.0–12.0)
Monocytes Absolute: 0.5 10*3/uL (ref 0.1–1.0)
NEUTROS ABS: 3.8 10*3/uL (ref 1.4–7.7)
NEUTROS PCT: 48.8 % (ref 43.0–77.0)
PLATELETS: 239 10*3/uL (ref 150.0–400.0)
RBC: 4.75 Mil/uL (ref 3.87–5.11)
RDW: 14.1 % (ref 11.5–15.5)
WBC: 7.7 10*3/uL (ref 4.0–10.5)

## 2015-11-21 LAB — BASIC METABOLIC PANEL
BUN: 13 mg/dL (ref 6–23)
CALCIUM: 9.4 mg/dL (ref 8.4–10.5)
CO2: 28 mEq/L (ref 19–32)
Chloride: 106 mEq/L (ref 96–112)
Creatinine, Ser: 0.9 mg/dL (ref 0.40–1.20)
GFR: 83.8 mL/min (ref >60.00)
Glucose, Bld: 90 mg/dL (ref 70–99)
POTASSIUM: 4.3 meq/L (ref 3.5–5.1)
Sodium: 141 mEq/L (ref 135–145)

## 2015-11-21 LAB — LIPID PANEL
CHOL/HDL RATIO: 2
CHOLESTEROL: 184 mg/dL (ref 0–200)
HDL: 82.7 mg/dL (ref >39.00)
LDL CALC: 91 mg/dL (ref 0–99)
NonHDL: 101.73
TRIGLYCERIDES: 56 mg/dL (ref 0.0–149.0)
VLDL: 11.2 mg/dL (ref 0.0–40.0)

## 2015-11-21 LAB — URINALYSIS, ROUTINE W REFLEX MICROSCOPIC
Bilirubin Urine: NEGATIVE
Hgb urine dipstick: NEGATIVE
KETONES UR: NEGATIVE
Leukocytes, UA: NEGATIVE
Nitrite: NEGATIVE
PH: 5 (ref 5.0–8.0)
RBC / HPF: NONE SEEN (ref 0–?)
SPECIFIC GRAVITY, URINE: 1.025 (ref 1.000–1.030)
Total Protein, Urine: NEGATIVE
UROBILINOGEN UA: 0.2 (ref 0.0–1.0)
Urine Glucose: NEGATIVE
WBC, UA: NONE SEEN (ref 0–?)

## 2015-11-21 LAB — TSH: TSH: 3.79 u[IU]/mL (ref 0.35–4.50)

## 2015-11-21 LAB — HEPATIC FUNCTION PANEL
ALK PHOS: 65 U/L (ref 39–117)
ALT: 10 U/L (ref 0–35)
AST: 16 U/L (ref 0–37)
Albumin: 4.2 g/dL (ref 3.5–5.2)
BILIRUBIN DIRECT: 0.1 mg/dL (ref 0.0–0.3)
TOTAL PROTEIN: 7.1 g/dL (ref 6.0–8.3)
Total Bilirubin: 0.5 mg/dL (ref 0.2–1.2)

## 2015-11-23 ENCOUNTER — Other Ambulatory Visit: Payer: Federal, State, Local not specified - PPO

## 2015-11-23 ENCOUNTER — Encounter: Payer: Self-pay | Admitting: Internal Medicine

## 2015-11-23 ENCOUNTER — Ambulatory Visit (INDEPENDENT_AMBULATORY_CARE_PROVIDER_SITE_OTHER): Payer: Federal, State, Local not specified - PPO | Admitting: Internal Medicine

## 2015-11-23 VITALS — BP 130/82 | HR 73 | Temp 98.4°F | Ht 67.0 in | Wt 134.0 lb

## 2015-11-23 DIAGNOSIS — F418 Other specified anxiety disorders: Secondary | ICD-10-CM | POA: Diagnosis not present

## 2015-11-23 DIAGNOSIS — Z0000 Encounter for general adult medical examination without abnormal findings: Secondary | ICD-10-CM | POA: Diagnosis not present

## 2015-11-23 DIAGNOSIS — M7122 Synovial cyst of popliteal space [Baker], left knee: Secondary | ICD-10-CM

## 2015-11-23 DIAGNOSIS — M79646 Pain in unspecified finger(s): Secondary | ICD-10-CM | POA: Diagnosis not present

## 2015-11-23 DIAGNOSIS — M5412 Radiculopathy, cervical region: Secondary | ICD-10-CM

## 2015-11-23 DIAGNOSIS — M7120 Synovial cyst of popliteal space [Baker], unspecified knee: Secondary | ICD-10-CM | POA: Insufficient documentation

## 2015-11-23 DIAGNOSIS — F419 Anxiety disorder, unspecified: Secondary | ICD-10-CM

## 2015-11-23 DIAGNOSIS — F32A Depression, unspecified: Secondary | ICD-10-CM

## 2015-11-23 DIAGNOSIS — F329 Major depressive disorder, single episode, unspecified: Secondary | ICD-10-CM

## 2015-11-23 DIAGNOSIS — G5622 Lesion of ulnar nerve, left upper limb: Secondary | ICD-10-CM | POA: Diagnosis not present

## 2015-11-23 MED ORDER — POTASSIUM CHLORIDE CRYS ER 20 MEQ PO TBCR: 20.0000 meq | EXTENDED_RELEASE_TABLET | Freq: Every day | ORAL | Status: DC

## 2015-11-23 MED ORDER — SPIRONOLACTONE 25 MG PO TABS: 25.0000 mg | ORAL_TABLET | Freq: Every day | ORAL | Status: DC

## 2015-11-23 NOTE — Progress Notes (Signed)
Subjective:    Patient ID: Danielle Williams, female    DOB: 06-06-1961, 54 y.o.   MRN: [id number]  HPI  Here for wellness and f/u;  Overall doing ok;  Pt denies Chest pain, worsening SOB, DOE, wheezing, orthopnea, PND, worsening LE edema, palpitations, dizziness or syncope.  Pt denies neurological change such as new headache, facial or extremity weakness.  Pt denies polydipsia, polyuria, or low sugar symptoms. Pt states overall good compliance with treatment and medications, good tolerability, and has been trying to follow appropriate diet.  Pt has had mild worsening depressive symptoms after sister died of met lung cancer (now deceased) and brother recently diagnosed with left eye tumor now with mets to brain, lung, and bones, but no suicidal ideation or panic. No fever, night sweats, wt loss, loss of appetite, or other constitutional symptoms.  Pt states good ability with ADL's, has low fall risk, home safety reviewed and adequate, no other significant changes in hearing or vision, and only occasionally active with exercise.  Has 2 tender firm lumps to left 5th finger dorsal aspect PIP, right index PIP.  Also with significant pain, swelling worsening to psot left knee known bakers cyst, she is asking for referral for further tx.  Also with mild symptoms left neck pain and tingling with radiation to the left upper arm to above the elbow, without less grip.  Past Medical History  Diagnosis Date  . Anemia     Iron def  . History of hepatitis     Type A  . Recurrent boils     Recurrent  . Vitamin D deficiency   . Hypertension    Past Surgical History  Procedure Laterality Date  . Abdominal hysterectomy      Due to fibroids    reports that she has been smoking Cigarettes.  She has a 15 pack-year smoking history. She has never used smokeless tobacco. She reports that she drinks alcohol. She reports that she does not use illicit drugs. family history includes Alcohol abuse in her cousin; Arthritis  in her other; Breast cancer in her cousin; Cancer in her other and other; Dementia in her mother; Diabetes in her sister; Heart disease in her other; Hypertension in her sister; Other in her father; Ovarian cancer in her sister; Throat cancer in her paternal aunt. There is no history of Colon cancer, Esophageal cancer, Rectal cancer, or Stomach cancer. No Known Allergies Current Outpatient Prescriptions on File Prior to Visit  Medication Sig Dispense Refill  . Biotin 10 MG TABS Take 1,000 mg by mouth daily.    Marland Kitchen FLUOCINOLONE ACETONIDE SCALP 0.01 % OIL Apply 1 application topically daily as needed.  2  . benzonatate (TESSALON) 200 MG capsule Take 1 capsule (200 mg total) by mouth 3 (three) times daily as needed for cough. (Patient not taking: Reported on 11/23/2015) 60 capsule 0  . diclofenac (VOLTAREN) 50 MG EC tablet Take 50 mg by mouth 2 (two) times daily with a meal.  0  . doxycycline (VIBRA-TABS) 100 MG tablet Take 1 tablet (100 mg total) by mouth 2 (two) times daily. (Patient not taking: Reported on 11/23/2015) 14 tablet 0   No current facility-administered medications on file prior to visit.   Review of Systems Constitutional: Negative for increased diaphoresis, other activity, appetite or siginficant weight change other than noted HENT: Negative for worsening hearing loss, ear pain, facial swelling, mouth sores and neck stiffness.   Eyes: Negative for other worsening pain, redness or visual disturbance.  Respiratory: Negative for shortness of breath and wheezing  Cardiovascular: Negative for chest pain and palpitations.  Gastrointestinal: Negative for diarrhea, blood in stool, abdominal distention or other pain Genitourinary: Negative for hematuria, flank pain or change in urine volume.  Musculoskeletal: Negative for myalgias or other joint complaints.  Skin: Negative for color change and wound or drainage.  Neurological: Negative for syncope and numbness. other than noted Hematological:  Negative for adenopathy. or other swelling Psychiatric/Behavioral: Negative for hallucinations, SI, self-injury, decreased concentration or other worsening agitation.      Objective:   Physical Exam BP 130/82 mmHg  Pulse 73  Temp(Src) 98.4 F (36.9 C) (Oral)  Ht 5' 7"  (1.702 m)  Wt 134 lb (60.782 kg)  BMI 20.98 kg/m2  SpO2 97% VS noted,  Constitutional: Pt is oriented to person, place, and time. Appears well-developed and well-nourished, in no significant distress Head: Normocephalic and atraumatic.  Right Ear: External ear normal.  Left Ear: External ear normal.  Nose: Nose normal.  Mouth/Throat: Oropharynx is clear and moist.  Eyes: Conjunctivae and EOM are normal. Pupils are equal, round, and reactive to light.  Neck: Normal range of motion. Neck supple. No JVD present. No tracheal deviation present or significant neck LA or mass Cardiovascular: Normal rate, regular rhythm, normal heart sounds and intact distal pulses.   Pulmonary/Chest: Effort normal and breath sounds without rales or wheezing  Abdominal: Soft. Bowel sounds are normal. NT. No HSM  Musculoskeletal: Normal range of motion. Exhibits no edema. post left knee with tender soft lump to fossa Lymphadenopathy:  Has no cervical adenopathy.  Neurological: Pt is alert and oriented to person, place, and time. Pt has normal reflexes. No cranial nerve deficit. Motor 5/5 intact, gait intact,  Skin: Skin is warm and dry. No rash noted.  Psychiatric:  Has dysphoric anxious mood and affect. Behavior is normal.  Spine nontender; left 5th finger dorsal 5 mm firm lump to tendon insertion site, also similar to right index PIP    Assessment & Plan:

## 2015-11-23 NOTE — Patient Instructions (Signed)
Please continue all other medications as before, and refills have been done if requested.  Please have the pharmacy call with any other refills you may need.  Please continue your efforts at being more active, low cholesterol diet, and weight control.  You are otherwise up to date with prevention measures today.  Please keep your appointments with your specialists as you may have planned  You will be contacted regarding the referral for: Dr Tamala Julian for the left knee  Please return in 6 months, or sooner if needed

## 2015-11-23 NOTE — Progress Notes (Signed)
Pre visit review using our clinic review tool, if applicable. No additional management support is needed unless otherwise documented below in the visit note. 

## 2015-11-24 LAB — HEPATITIS C ANTIBODY: HCV Ab: NEGATIVE

## 2015-11-25 DIAGNOSIS — M5412 Radiculopathy, cervical region: Secondary | ICD-10-CM | POA: Insufficient documentation

## 2015-11-25 DIAGNOSIS — F419 Anxiety disorder, unspecified: Secondary | ICD-10-CM | POA: Insufficient documentation

## 2015-11-25 DIAGNOSIS — F32A Depression, unspecified: Secondary | ICD-10-CM | POA: Insufficient documentation

## 2015-11-25 DIAGNOSIS — M79646 Pain in unspecified finger(s): Secondary | ICD-10-CM | POA: Insufficient documentation

## 2015-11-25 DIAGNOSIS — F329 Major depressive disorder, single episode, unspecified: Secondary | ICD-10-CM | POA: Insufficient documentation

## 2015-11-25 NOTE — Assessment & Plan Note (Signed)
Moderate worsening symptoms, for referral Dr Smith/[facility] for further tx

## 2015-11-25 NOTE — Assessment & Plan Note (Signed)
Mild, d/w pt, no neuro changes, to hold on imaging for now, declines further tx,  to f/u any worsening symptoms or concerns

## 2015-11-25 NOTE — Assessment & Plan Note (Signed)

## 2015-11-25 NOTE — Assessment & Plan Note (Signed)
C/w tendonitis, mild, d/w pt, declines further tx or hand surgury eval

## 2015-11-25 NOTE — Assessment & Plan Note (Signed)
Mild situational worsening, declines counseling referral, declines trial ssri,  to f/u any worsening symptoms or concerns

## 2015-11-28 ENCOUNTER — Encounter: Payer: Federal, State, Local not specified - PPO | Admitting: Internal Medicine

## 2015-12-07 ENCOUNTER — Other Ambulatory Visit (INDEPENDENT_AMBULATORY_CARE_PROVIDER_SITE_OTHER): Payer: Federal, State, Local not specified - PPO

## 2015-12-07 ENCOUNTER — Ambulatory Visit (INDEPENDENT_AMBULATORY_CARE_PROVIDER_SITE_OTHER): Payer: Federal, State, Local not specified - PPO | Admitting: Family Medicine

## 2015-12-07 ENCOUNTER — Encounter: Payer: Self-pay | Admitting: Family Medicine

## 2015-12-07 VITALS — BP 132/82 | HR 81 | Ht 67.0 in | Wt 134.0 lb

## 2015-12-07 DIAGNOSIS — M25562 Pain in left knee: Secondary | ICD-10-CM

## 2015-12-07 DIAGNOSIS — G5622 Lesion of ulnar nerve, left upper limb: Secondary | ICD-10-CM | POA: Diagnosis not present

## 2015-12-07 DIAGNOSIS — M7122 Synovial cyst of popliteal space [Baker], left knee: Secondary | ICD-10-CM

## 2015-12-07 MED ORDER — DICLOFENAC SODIUM 2 % TD SOLN
TRANSDERMAL | Status: DC
Start: 2015-12-07 — End: 2016-05-21

## 2015-12-07 NOTE — Patient Instructions (Addendum)
Good to see you.  Ice 20 minutes 2 times daily. Usually after activity and before bed. Exercises 3 times a week.  Compression sleeve for the knee can help pennsaid pinkie amount topically 2 times daily as needed.  Vitamin D 2000-4000 IU daily Stay active and wear good shoes See me again in 3 weeks or if it gets bigger again.

## 2015-12-07 NOTE — Progress Notes (Signed)
[name] [place], [place] [address] Phone: [phone] Subjective:    I'm seeing this patient by the request  of:  Cathlean Cower, MD   CC: Knee pain and swelling  [id number] Danielle Williams is a 54 y.o. female coming in with complaint of knee pain with swelling. Patient states that this has come and gone for many years. Has had 2 injections in the knees over the course last 7 years. Patient did have significant amount of pain when she saw her primary care provider last week but the swelling seemed to improve. Feels that the cyst has ruptured somewhat. States that it still feels somewhat full. Patient does do a significant amount walking daily. Patient states when she walks a significant amount though unfortunately that seems significantly more problem. Does wear compression sleeve fairly regularly. Tries to wear good shoes. But this continues to happen.  Past Medical History  Diagnosis Date  . Anemia     Iron def  . History of hepatitis     Type A  . Recurrent boils     Recurrent  . Vitamin D deficiency   . Hypertension    Past Surgical History  Procedure Laterality Date  . Abdominal hysterectomy      Due to fibroids   Social History  Substance Use Topics  . Smoking status: Current Some Day Smoker -- 0.50 packs/day for 30 years    Types: Cigarettes  . Smokeless tobacco: Never Used     Comment: She is trying to taper.    . Alcohol Use: 0.0 oz/week    0 Standard drinks or equivalent per week     Comment: socially   No Known Allergies Family History  Problem Relation Age of Onset  . Hypertension Sister   . Diabetes Sister   . Arthritis Other     Parent  . Cancer Other     Breast and ovary-sister and cousin  . Heart disease Other     Aunt  . Cancer Other     Brain-nephew   . Alcohol abuse Cousin     Multiple  . Breast cancer Cousin   . Ovarian cancer Sister   . Throat cancer Paternal Aunt   . Dementia Mother     age  42 now   . Other Father     muscle disease /black lung   . Colon cancer Neg Hx   . Esophageal cancer Neg Hx   . Rectal cancer Neg Hx   . Stomach cancer Neg Hx         Past medical history, social, surgical and family history all reviewed in electronic medical record.   Review of Systems: No headache, visual changes, nausea, vomiting, diarrhea, constipation, dizziness, abdominal pain, skin rash, fevers, chills, night sweats, weight loss, swollen lymph nodes, body aches, joint swelling, muscle aches, chest pain, shortness of breath, mood changes.   Objective Blood pressure 132/82, pulse 81, height 5\' 7"  (1.702 m), weight 134 lb (60.782 kg), SpO2 96 %.  General: No apparent distress alert and oriented x3 mood and affect normal, dressed appropriately.  HEENT: Pupils equal, extraocular movements intact  Respiratory: Patient's speak in full sentences and does not appear short of breath  Cardiovascular: No lower extremity edema, non tender, no erythema  Skin: Warm dry intact with no signs of infection or rash on extremities or on axial skeleton.  Abdomen: Soft nontender  Neuro: Cranial nerves II through XII are intact, neurovascularly intact  in all extremities with 2+ DTRs and 2+ pulses.  Lymph: No lymphadenopathy of posterior or anterior cervical chain or axillae bilaterally.  Gait normal with good balance and coordination.  MSK:  Non tender with full range of motion and good stability and symmetric strength and tone of shoulders, elbows, wrist, hip, and ankles bilaterally.  Knee: Left Normal to inspection with no erythema or effusion or obvious bony abnormalities. Tenderness in the posterior medial fossa appears to have cystic formation. ROM full in flexion and extension and lower leg rotation. Ligaments with solid consistent endpoints including ACL, PCL, LCL, MCL. Negative Mcmurray's, Apley's, and Thessalonian tests. Non painful patellar compression. Patellar glide with mild  crepitus. Patellar and quadriceps tendons unremarkable. Hamstring and quadriceps strength is normal.   MSK US performed of: Left knee This study was ordered, performed, and interpreted by Charlann Boxer D.O.  Knee: All structures visualized. Anteromedial, anterolateral, posteromedial, and posterolateral menisci unremarkable without tearing, fraying, effusion, or displacement. Mild narrowing of the medial joint space Patient does have a large cyst noted approximate 3 cm in length on the posterior medial aspect of the knee. Patellar Tendon unremarkable on long and transverse views without effusion. No abnormality of prepatellar bursa. LCL and MCL unremarkable on long and transverse views. No abnormality of origin of medial or lateral head of the gastrocnemius.  IMPRESSION:  Ganglion cyst or bakers cyst  Procedure: Real-time Ultrasound Guided Injection of left knee Device: GE Logiq E  Ultrasound guided injection is preferred based studies that show increased duration, increased effect, greater accuracy, decreased procedural pain, increased response rate, and decreased cost with ultrasound guided versus blind injection.  Verbal informed consent obtained.  Time-out conducted.  Noted no overlying erythema, induration, or other signs of local infection.  Skin prepped in a sterile fashion.  Local anesthesia: Topical Ethyl chloride.  With sterile technique and under real time ultrasound guidance: With a 18-gauge 1 1/2 inch needle patient was injected with 4 cc of 0.5% Marcaine and attempted aspiration with mild gel-like fluid removed then injected 1 cc of Kenalog 40 mg/dL. This was from a posterior medial approach.  Completed without difficulty  Pain immediately resolved suggesting accurate placement of the medication.  Advised to call if fevers/chills, erythema, induration, drainage, or persistent bleeding.  Images permanently stored and available for review in the ultrasound unit.  Impression:  Technically successful ultrasound guided injection.    Impression and Recommendations:     This case required medical decision making of moderate complexity.

## 2015-12-07 NOTE — Progress Notes (Signed)
Pre visit review using our clinic review tool, if applicable. No additional management support is needed unless otherwise documented below in the visit note. 

## 2015-12-07 NOTE — Assessment & Plan Note (Signed)
Patient did have an aspiration of the knee today.  patient did respond well to it. Patient did have a cyst but it seemed to be more ganglion in nature. Patient's is going to wear compression sleeve, home exercises and patient was sent in a topical anti-inflammatory. We discussed which activities to potentially avoid over the next 48 hours. Patient will do and icing protocol. Patient come back and see me again in 3-4 weeks for further evaluation and treatment.

## 2015-12-27 ENCOUNTER — Encounter: Payer: Self-pay | Admitting: Family Medicine

## 2015-12-27 ENCOUNTER — Ambulatory Visit (INDEPENDENT_AMBULATORY_CARE_PROVIDER_SITE_OTHER): Payer: Federal, State, Local not specified - PPO | Admitting: Family Medicine

## 2015-12-27 ENCOUNTER — Ambulatory Visit: Payer: Federal, State, Local not specified - PPO | Admitting: Family Medicine

## 2015-12-27 VITALS — BP 118/80 | HR 68 | Wt 135.0 lb

## 2015-12-27 DIAGNOSIS — M7122 Synovial cyst of popliteal space [Baker], left knee: Secondary | ICD-10-CM

## 2015-12-27 DIAGNOSIS — G5622 Lesion of ulnar nerve, left upper limb: Secondary | ICD-10-CM

## 2015-12-27 NOTE — Assessment & Plan Note (Signed)
Doing well at this time. Discussed compression, icing, and topical anti-inflammatories. If any reaccumulation occurs we can do more drainage. Did not feel that further imaging is necessary. If any internal derangement occurs patient will come back sooner. Otherwise patient can come back on an as-needed basis.

## 2015-12-27 NOTE — Patient Instructions (Signed)
So good to see you Ice is your friend when needed Start increasing activity and I expect some discomfort but not a ton.  If pain take a step back If more swelling nor pain occurs that stops you from activity you know where I am  Have a great trip to the beach!

## 2015-12-27 NOTE — Progress Notes (Signed)
[facility] [address] [place], [place] [address] Phone: [phone] Subjective:      CC: Knee pain and swelling follow-up  [id number] Danielle Williams is a 55 y.o. female coming in with complaint of knee pain with swelling. Patient was found to have a Baker cyst on the back of her knee. Reducing. Patient went did have aspiration as well as an injection. Patient was to do home exercises and icing protocol. Patient states that she is feeling about 90% better. Has been able to work on a more regular basis. Not having the decrease in range of motion of the discomfort. Has not had any recurrent swelling at the time. Fairly happy with the results.  Past Medical History  Diagnosis Date  . Anemia     Iron def  . History of hepatitis     Type A  . Recurrent boils     Recurrent  . Vitamin D deficiency   . Hypertension    Past Surgical History  Procedure Laterality Date  . Abdominal hysterectomy      Due to fibroids   Social History  Substance Use Topics  . Smoking status: Current Some Day Smoker -- 0.50 packs/day for 30 years    Types: Cigarettes  . Smokeless tobacco: Never Used     Comment: She is trying to taper.    . Alcohol Use: 0.0 oz/week    0 Standard drinks or equivalent per week     Comment: socially   No Known Allergies Family History  Problem Relation Age of Onset  . Hypertension Sister   . Diabetes Sister   . Arthritis Other     Parent  . Cancer Other     Breast and ovary-sister and cousin  . Heart disease Other     Aunt  . Cancer Other     Brain-nephew   . Alcohol abuse Cousin     Multiple  . Breast cancer Cousin   . Ovarian cancer Sister   . Throat cancer Paternal Aunt   . Dementia Mother     age 29 now   . Other Father     muscle disease /black lung   . Colon cancer Neg Hx   . Esophageal cancer Neg Hx   . Rectal cancer Neg Hx   . Stomach cancer Neg Hx         Past medical history, social, surgical and family  history all reviewed in electronic medical record.   Review of Systems: No headache, visual changes, nausea, vomiting, diarrhea, constipation, dizziness, abdominal pain, skin rash, fevers, chills, night sweats, weight loss, swollen lymph nodes, body aches, joint swelling, muscle aches, chest pain, shortness of breath, mood changes.   Objective Blood pressure 118/80, pulse 68, weight 135 lb (61.236 kg), SpO2 99 %.  General: No apparent distress alert and oriented x3 mood and affect normal, dressed appropriately.  HEENT: Pupils equal, extraocular movements intact  Respiratory: Patient's speak in full sentences and does not appear short of breath  Cardiovascular: No lower extremity edema, non tender, no erythema  Skin: Warm dry intact with no signs of infection or rash on extremities or on axial skeleton.  Abdomen: Soft nontender  Neuro: Cranial nerves II through XII are intact, neurovascularly intact in all extremities with 2+ DTRs and 2+ pulses.  Lymph: No lymphadenopathy of posterior or anterior cervical chain or axillae bilaterally.  Gait normal with good balance and coordination.  MSK:  Non tender with full range  of motion and good stability and symmetric strength and tone of shoulders, elbows, wrist, hip, and ankles bilaterally.  Knee: Left Normal to inspection with no erythema or effusion or obvious bony abnormalities. Nontender in exam with no fullness of the popliteal fossa ROM full in flexion and extension and lower leg rotation. Ligaments with solid consistent endpoints including ACL, PCL, LCL, MCL. Negative Mcmurray's, Apley's, and Thessalonian tests. Non painful patellar compression. Patellar glide with mild crepitus. Patellar and quadriceps tendons unremarkable. Hamstring and quadriceps strength is normal. Contralateral knee unremarkable   Impression and Recommendations:     This case required medical decision making of moderate complexity.

## 2015-12-30 ENCOUNTER — Other Ambulatory Visit: Payer: Self-pay | Admitting: Internal Medicine

## 2016-03-19 ENCOUNTER — Ambulatory Visit (INDEPENDENT_AMBULATORY_CARE_PROVIDER_SITE_OTHER): Payer: Federal, State, Local not specified - PPO | Admitting: Internal Medicine

## 2016-03-19 VITALS — BP 122/82 | HR 78 | Temp 98.6°F | Resp 20 | Wt 132.0 lb

## 2016-03-19 DIAGNOSIS — I10 Essential (primary) hypertension: Secondary | ICD-10-CM

## 2016-03-19 DIAGNOSIS — F329 Major depressive disorder, single episode, unspecified: Secondary | ICD-10-CM

## 2016-03-19 DIAGNOSIS — F418 Other specified anxiety disorders: Secondary | ICD-10-CM | POA: Diagnosis not present

## 2016-03-19 DIAGNOSIS — G4700 Insomnia, unspecified: Secondary | ICD-10-CM | POA: Diagnosis not present

## 2016-03-19 DIAGNOSIS — F4321 Adjustment disorder with depressed mood: Secondary | ICD-10-CM | POA: Insufficient documentation

## 2016-03-19 DIAGNOSIS — F419 Anxiety disorder, unspecified: Secondary | ICD-10-CM

## 2016-03-19 DIAGNOSIS — F32A Depression, unspecified: Secondary | ICD-10-CM

## 2016-03-19 MED ORDER — CLONAZEPAM 0.5 MG PO TABS
0.5000 mg | ORAL_TABLET | Freq: Two times a day (BID) | ORAL | Status: DC | PRN
Start: 2016-03-19 — End: 2016-05-21

## 2016-03-19 MED ORDER — ZOLPIDEM TARTRATE 10 MG PO TABS: 10.0000 mg | ORAL_TABLET | Freq: Every evening | ORAL | Status: DC | PRN

## 2016-03-19 NOTE — Progress Notes (Signed)
Subjective:    Patient ID: Danielle Williams, female    DOB: [birth year], 55 y.o.   MRN: [id number]  HPI  Here with sudden worsening onset anxiety and insomnia, mild to mod, x 1 wk after brother died relatively suddenly of metastatic cancer.  Has had several panic attacks but not had to go to ER.  Does have access to hospice counseling but does not start until next wk.  Just cant seem to get to sleep each night, and feels terrible next day with lack of sleep.  Went to work yesterday, but just could not go today, too difficult to concentrate and perform her work.  Similar episode happened several years ago when her sister died.  Pt denies chest pain, increased sob or doe, wheezing, orthopnea, PND, increased LE swelling, palpitations, dizziness or syncope.  Pt denies new neurological symptoms such as new headache, or facial or extremity weakness or numbness   Pt denies polydipsia, polyuria,  Past Medical History  Diagnosis Date  . Anemia     Iron def  . History of hepatitis     Type A  . Recurrent boils     Recurrent  . Vitamin D deficiency   . Hypertension    Past Surgical History  Procedure Laterality Date  . Abdominal hysterectomy      Due to fibroids    reports that she has been smoking Cigarettes.  She has a 15 pack-year smoking history. She has never used smokeless tobacco. She reports that she drinks alcohol. She reports that she does not use illicit drugs. family history includes Alcohol abuse in her cousin; Arthritis in her other; Breast cancer in her cousin; Cancer in her other and other; Dementia in her mother; Diabetes in her sister; Heart disease in her other; Hypertension in her sister; Other in her father; Ovarian cancer in her sister; Throat cancer in her paternal aunt. There is no history of Colon cancer, Esophageal cancer, Rectal cancer, or Stomach cancer. No Known Allergies Current Outpatient Prescriptions on File Prior to Visit  Medication Sig Dispense Refill  . Biotin 10 MG  TABS Take 1,000 mg by mouth daily.    . Diclofenac Sodium 2 % SOLN Apply one small pump to affected area twice daily. 1 Bottle 1  . FLUOCINOLONE ACETONIDE SCALP 0.01 % OIL Apply 1 application topically daily as needed.  2  . KLOR-CON M20 20 MEQ tablet TAKE 1 TABLET (20 MEQ TOTAL) BY MOUTH DAILY. 90 tablet 3  . potassium chloride SA (KLOR-CON M20) 20 MEQ tablet Take 1 tablet (20 mEq total) by mouth daily. 90 tablet 3  . spironolactone (ALDACTONE) 25 MG tablet Take 1 tablet (25 mg total) by mouth daily. 90 tablet 3   No current facility-administered medications on file prior to visit.   Review of Systems  Constitutional: Negative for unusual diaphoresis or night sweats HENT: Negative for ear swelling or discharge Eyes: Negative for worsening visual haziness  Respiratory: Negative for choking and stridor.   Gastrointestinal: Negative for distension or worsening eructation Genitourinary: Negative for retention or change in urine volume.  Musculoskeletal: Negative for other MSK pain or swelling Skin: Negative for color change and worsening wound Neurological: Negative for tremors and numbness other than noted  Psychiatric/Behavioral: Negative for decreased concentration or agitation other than above       Objective:   Physical Exam BP 122/82 mmHg  Pulse 78  Temp(Src) 98.6 F (37 C) (Oral)  Resp 20  Wt 132 lb (59.875 kg)  SpO2 90% VS noted,  Constitutional: Pt appears in no apparent distress HENT: Head: NCAT.  Right Ear: External ear normal.  Left Ear: External ear normal.  Eyes: . Pupils are equal, round, and reactive to light. Conjunctivae and EOM are normal Neck: Normal range of motion. Neck supple.  Cardiovascular: Normal rate and regular rhythm.   Pulmonary/Chest: Effort normal and breath sounds without rales or wheezing.  Neurological: Pt is alert. Not confused , motor grossly intact Skin: Skin is warm. No rash, no LE edema Psychiatric: Pt behavior is normal. No agitation.  1-2+ nervous    Assessment & Plan:

## 2016-03-19 NOTE — Progress Notes (Signed)
Pre visit review using our clinic review tool, if applicable. No additional management support is needed unless otherwise documented below in the visit note. 

## 2016-03-19 NOTE — Patient Instructions (Signed)
Please take all new medication as prescribed - the ambien for sleep, and the klonopin for nerves  Please continue all other medications as before, and refills have been done if requested.  Please have the pharmacy call with any other refills you may need.  Please keep your appointments with your specialists as you may have planned - the hospice counseling  You are given the work note as well

## 2016-03-20 NOTE — Assessment & Plan Note (Signed)
stable overall by history and exam, recent data reviewed with pt, and pt to continue medical treatment as before,  to f/u any worsening symptoms or concerns BP Readings from Last 3 Encounters:  03/19/16 122/82  12/27/15 118/80  12/07/15 132/82

## 2016-03-20 NOTE — Assessment & Plan Note (Signed)
Recent onset related to brother's passing, to f/u with hospice counseling next week, verified no SI or HI

## 2016-03-20 NOTE — Assessment & Plan Note (Signed)
Mild to mod, sudden onset related to situational, for short course ambien prn,  to f/u any worsening symptoms or concerns

## 2016-03-20 NOTE — Assessment & Plan Note (Signed)
Verified no SI or HI, but for klonopin bid prn, also off work this week/note given, f/u counseling as well for this,  to f/u any worsening symptoms or concerns

## 2016-03-28 ENCOUNTER — Telehealth: Payer: Self-pay | Admitting: Internal Medicine

## 2016-03-28 NOTE — Telephone Encounter (Signed)
Potassium has been normal on EMR for the last 3 yrs  OK to cont same tx

## 2016-03-28 NOTE — Telephone Encounter (Signed)
Threasa Beards called from [facility] want to make sure that Dr. Jenny Reichmann is monitoring Mrs. Danielle Williams potassium level because she is on potassium sparing diuretic. Pease give her a call back  # [phone]

## 2016-03-28 NOTE — Telephone Encounter (Signed)
Contacted [facility]) and informed her that we do monitor the pts potassium.

## 2016-05-21 ENCOUNTER — Ambulatory Visit (INDEPENDENT_AMBULATORY_CARE_PROVIDER_SITE_OTHER): Payer: Federal, State, Local not specified - PPO | Admitting: Cardiology

## 2016-05-21 ENCOUNTER — Encounter: Payer: Self-pay | Admitting: Cardiology

## 2016-05-21 VITALS — BP 130/92 | HR 70 | Ht 67.0 in | Wt 129.0 lb

## 2016-05-21 DIAGNOSIS — R42 Dizziness and giddiness: Secondary | ICD-10-CM

## 2016-05-21 NOTE — Progress Notes (Signed)
   HPI The patient presents for evaluation of HTN.  Since I last saw her she has done well from a health standpoint.  She has had a very difficult year with a sister and brother both dying of Cancer.  Her aged mother fell and broke ribs. She has had to drive to [facility] frequently and she has had to be a caregiver.    The patient denies any new symptoms such as chest discomfort, neck or arm discomfort. There has been no new shortness of breath, PND or orthopnea. She is still smoking.  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   No Known Allergies  Current Outpatient Prescriptions  Medication Sig Dispense Refill  . Biotin 10 MG TABS Take 1,000 mg by mouth daily.    . Cholecalciferol (VITAMIN D PO) Take 1 tablet by mouth daily.    . potassium chloride SA (KLOR-CON M20) 20 MEQ tablet Take 1 tablet (20 mEq total) by mouth daily. 90 tablet 3  . spironolactone (ALDACTONE) 25 MG tablet Take 1 tablet (25 mg total) by mouth daily. 90 tablet 3  . zolpidem (AMBIEN) 10 MG tablet Take 1 tablet (10 mg total) by mouth at bedtime as needed for sleep. 30 tablet 1   No current facility-administered medications for this visit.    Past Medical History  Diagnosis Date  . Anemia     Iron def  . History of hepatitis     Type A  . Recurrent boils     Recurrent  . Vitamin D deficiency   . Hypertension     Past Surgical History  Procedure Laterality Date  . Abdominal hysterectomy      Due to fibroids   ROS: As stated in the HPI and negative for all other systems.  PHYSICAL EXAM BP 130/92 mmHg  Pulse 70  Ht 5\' 7"  (1.702 m)  Wt 129 lb (58.514 kg)  BMI 20.20 kg/m2 GENERAL:  Well appearing NECK:  No jugular venous distention, waveform within normal limits, carotid upstroke brisk and symmetric, no bruits, no thyromegaly LYMPHATICS:  No cervical, inguinal adenopathy LUNGS:  Clear to auscultation bilaterally CHEST:  Tenderness to palpation, right upper  chest HEART:  PMI not displaced or sustained,S1 and S2 within normal limits, no S3, no S4, no clicks, no rubs, no murmurs ABD:  Flat, positive bowel sounds normal in frequency in pitch, no bruits, no rebound, no guarding, no midline pulsatile mass, no hepatomegaly, no splenomegaly EXT:  2 plus pulses throughout, no edema, no cyanosis no clubbing  EKG:  Sinus rhythm, rate 70, left axis deviation, incomplete right bundle branch block, no acute ST-T wave changes.  05/21/2016   ASSESSMENT AND PLAN  HTN (hypertension) - Her blood pressure is well controlled. She will remain on the meds as listed.  She is up to date with labs.   Tobacco abuse -  She still smokes a cigarettes. We have discussed the need for complete abstinence.  Carotid artery stenosis -  No follow up is indicated.  Insomnia - She was told about the black box warning concerning women and higher dose Ambien.

## 2016-05-21 NOTE — Patient Instructions (Signed)
Your physician wants you to follow-up in: 1 Year. You will receive a reminder letter in the mail two months in advance. If you don't receive a letter, please call our office to schedule the follow-up appointment.  

## 2016-10-22 DIAGNOSIS — L668 Other cicatricial alopecia: Secondary | ICD-10-CM | POA: Diagnosis not present

## 2016-10-22 DIAGNOSIS — L0292 Furuncle, unspecified: Secondary | ICD-10-CM | POA: Diagnosis not present

## 2016-10-30 DIAGNOSIS — Z01419 Encounter for gynecological examination (general) (routine) without abnormal findings: Secondary | ICD-10-CM | POA: Diagnosis not present

## 2016-10-30 DIAGNOSIS — Z6820 Body mass index (BMI) 20.0-20.9, adult: Secondary | ICD-10-CM | POA: Diagnosis not present

## 2016-10-30 DIAGNOSIS — Z1213 Encounter for screening for malignant neoplasm of small intestine: Secondary | ICD-10-CM | POA: Diagnosis not present

## 2016-12-15 ENCOUNTER — Other Ambulatory Visit: Payer: Self-pay | Admitting: Internal Medicine

## 2017-01-07 ENCOUNTER — Other Ambulatory Visit: Payer: Self-pay | Admitting: Internal Medicine

## 2017-01-27 NOTE — Progress Notes (Signed)
[facility] [place], [place] [address] Phone: [phone] Subjective:    I'm seeing this patient by the request  of:  Cathlean Cower, MD   CC: leg pain left great then right   [id number]  Danielle Williams is a 56 y.o. female coming in with complaint of left leg pain. Patient and see me greater than one year ago and did have a Baker's cyst of the knee on the left. .Patient states that she is having more of a left groin pain. Seems to radiate down towards her ankle. States it's worse when she has changed her walking regimen. Has been walking a lot more. Patient states that then it seemed to start hurting her more in the back. Patient states that she had weakness one day but then it completely resolved. Rates severity of pain though now still is 5 out of 10.     Past Medical History:  Diagnosis Date  . Anemia    Iron def  . History of hepatitis    Type A  . Hypertension   . Recurrent boils    Recurrent  . Vitamin D deficiency    Past Surgical History:  Procedure Laterality Date  . ABDOMINAL HYSTERECTOMY     Due to fibroids   Social History   Social History  . Marital status: Divorced    Spouse name: N/A  . Number of children: N/A  . Years of education: N/A   Social History Main Topics  . Smoking status: Current Some Day Smoker    Packs/day: 0.50    Years: 30.00    Types: Cigarettes  . Smokeless tobacco: Never Used     Comment: She is trying to taper.    . Alcohol use 0.0 oz/week     Comment: socially  . Drug use: No     Comment: past hx   . Sexual activity: Not on file   Other Topics Concern  . Not on file   Social History Narrative  . No narrative on file   No Known Allergies Family History  Problem Relation Age of Onset  . Hypertension Sister   . Diabetes Sister   . Arthritis Other     Parent  . Cancer Other     Breast and ovary-sister and cousin  . Heart disease Other     Aunt  . Cancer Other    Brain-nephew   . Alcohol abuse Cousin     Multiple  . Breast cancer Cousin   . Ovarian cancer Sister   . Throat cancer Paternal Aunt   . Dementia Mother     age 72 now   . Other Father     muscle disease /black lung   . Colon cancer Neg Hx   . Esophageal cancer Neg Hx   . Rectal cancer Neg Hx   . Stomach cancer Neg Hx     Past medical history, social, surgical and family history all reviewed in electronic medical record.  No pertanent information unless stated regarding to the chief complaint.   Review of Systems:Review of systems updated and as accurate as of 01/27/17  No headache, visual changes, nausea, vomiting, diarrhea, constipation, dizziness, abdominal pain, skin rash, fevers, chills, night sweats, weight loss, swollen lymph nodes,chest pain, shortness of breath, mood changes. Positive muscle aches, joint swelling, body aches.  Objective  There were no vitals taken for this visit. Systems examined below as of 01/27/17   General: No apparent distress  alert and oriented x3 mood and affect normal, dressed appropriately.  HEENT: Pupils equal, extraocular movements intact  Respiratory: Patient's speak in full sentences and does not appear short of breath  Cardiovascular: No lower extremity edema, non tender, no erythema  Skin: Warm dry intact with no signs of infection or rash on extremities or on axial skeleton.  Abdomen: Soft nontender  Neuro: Cranial nerves II through XII are intact, neurovascularly intact in all extremities with 2+ DTRs and 2+ pulses.  Lymph: No lymphadenopathy of posterior or anterior cervical chain or axillae bilaterally.  Gait normal with good balance and coordination.  MSK:  Non tender with full range of motion and good stability and symmetric strength and tone of shoulders, elbows, wrist, hip, and ankles bilaterally.  Knee: Left Normal to inspection with no erythema or effusion or obvious bony abnormalities. Palpation normal with no warmth, joint line  tenderness, patellar tenderness, or condyle tenderness. ROM full in flexion and extension and lower leg rotation. Ligaments with solid consistent endpoints including ACL, PCL, LCL, MCL. Negative Mcmurray's, Apley's, and Thessalonian tests. Mild painful patellar compression. Patellar glide with moderate crepitus. Patellar and quadriceps tendons unremarkable. Hamstring and quadriceps strength is normal.  Contralateral knee unremarkable Negative straight leg test. Mild tenderness over the sacroiliac joint and mild positive Corky Sox on the left  MSK US performed of: Left This study was ordered, performed, and interpreted by Charlann Boxer D.O.  Knee: All structures visualized. Anteromedial, anterolateral, posteromedial, and posterolateral menisci unremarkable without tearing, fraying, effusion, or displacement. Patient does have narrowing over the patellofemoral joint laterally. Mild hypoechoic changes and trace effusion noted. Patellar Tendon unremarkable on long and transverse views without effusion. No abnormality of prepatellar bursa. LCL and MCL unremarkable on long and transverse views. No abnormality of origin of medial or lateral head of the gastrocnemius.  IMPRESSION:  Mild to moderate patellofemoral arthritis..     Impression and Recommendations:     This case required medical decision making of moderate complexity.      Note: This dictation was prepared with Dragon dictation along with smaller phrase technology. Any transcriptional errors that result from this process are unintentional.

## 2017-01-28 ENCOUNTER — Ambulatory Visit (INDEPENDENT_AMBULATORY_CARE_PROVIDER_SITE_OTHER)
Admission: RE | Admit: 2017-01-28 | Discharge: 2017-01-28 | Disposition: A | Discharge status: Home / Self Care | Payer: Federal, State, Local not specified - PPO | Source: Ambulatory Visit | Attending: Family Medicine | Admitting: Family Medicine

## 2017-01-28 ENCOUNTER — Ambulatory Visit: Payer: Self-pay

## 2017-01-28 ENCOUNTER — Encounter: Payer: Self-pay | Admitting: Family Medicine

## 2017-01-28 ENCOUNTER — Ambulatory Visit (INDEPENDENT_AMBULATORY_CARE_PROVIDER_SITE_OTHER): Payer: Federal, State, Local not specified - PPO | Admitting: Family Medicine

## 2017-01-28 VITALS — BP 138/86 | HR 64 | Ht 67.0 in | Wt 117.0 lb

## 2017-01-28 DIAGNOSIS — M25562 Pain in left knee: Secondary | ICD-10-CM

## 2017-01-28 DIAGNOSIS — M545 Low back pain, unspecified: Secondary | ICD-10-CM

## 2017-01-28 DIAGNOSIS — M1712 Unilateral primary osteoarthritis, left knee: Secondary | ICD-10-CM | POA: Diagnosis not present

## 2017-01-28 DIAGNOSIS — E569 Vitamin deficiency, unspecified: Secondary | ICD-10-CM

## 2017-01-28 MED ORDER — VITAMIN D (ERGOCALCIFEROL) 1.25 MG (50000 UNIT) PO CAPS: 50000.0000 [IU] | ORAL_CAPSULE | ORAL | 0 refills | Status: DC

## 2017-01-28 NOTE — Patient Instructions (Signed)
I sorry for your lose.  We will get xrays today  Ice 20 minutes 2 times daily. Usually after activity and before bed. pennsaid pinkie amount topically 2 times daily as needed.  Once weekly vitamin D for next 12 weeks Iron 65mg  daily with 500mg  of vitamin C.  If constipation then go down to 3 times a week.  I want to make sure you are doing better and I want to see you again in 2-3 weeks.

## 2017-01-28 NOTE — Assessment & Plan Note (Signed)
Once weekly vitamin D given. 

## 2017-01-28 NOTE — Assessment & Plan Note (Signed)
Discussed with patient at great length. I do believe that some of patient's pain is secondary to more of a mild patellofemoral arthritis. We discussed possible topical anti-inflammatories, icing, home exercises. Work with Product/process development scientist greater detail. We discussed which activities to potentially avoid. Once weekly vitamin D for strength and endurance. Follow-up again in 4 weeks. Worsening symptoms consider injection. X-rays ordered today of the knee as well as x-rays the lower back to make sure no radicular symptoms with the radiation she had previously.

## 2017-02-13 ENCOUNTER — Encounter: Payer: Self-pay | Admitting: Family Medicine

## 2017-02-13 ENCOUNTER — Ambulatory Visit (INDEPENDENT_AMBULATORY_CARE_PROVIDER_SITE_OTHER): Payer: Federal, State, Local not specified - PPO | Admitting: Family Medicine

## 2017-02-13 VITALS — BP 110/88 | HR 84 | Ht 67.0 in | Wt 117.0 lb

## 2017-02-13 DIAGNOSIS — M7122 Synovial cyst of popliteal space [Baker], left knee: Secondary | ICD-10-CM | POA: Diagnosis not present

## 2017-02-13 DIAGNOSIS — M170 Bilateral primary osteoarthritis of knee: Secondary | ICD-10-CM

## 2017-02-13 NOTE — Progress Notes (Signed)
[facility] [place], [place] [address] Phone: [phone] Subjective:    I'm seeing this patient by the request  of:  Danielle Cower, MD   CC: leg pain left great then right f/u  [id number]  Danielle Williams is a 56 y.o. female coming in with complaint of left leg pain. Patient was found have bilateral patellofemoral arthritis. Patient states that the back seems to be doing relatively well. Patient unfortunate continues to have difficulty though with walking on a regular basis. Patient isn't doing the home exercises in the icing protocol. Continues to have trouble where she feels like she is having swelling again today. Worse with going up and down stairs.     Past Medical History:  Diagnosis Date  . Anemia    Iron def  . History of hepatitis    Type A  . Hypertension   . Recurrent boils    Recurrent  . Vitamin D deficiency    Past Surgical History:  Procedure Laterality Date  . ABDOMINAL HYSTERECTOMY     Due to fibroids   Social History   Social History  . Marital status: Divorced    Spouse name: N/A  . Number of children: N/A  . Years of education: N/A   Social History Main Topics  . Smoking status: Current Some Day Smoker    Packs/day: 0.50    Years: 30.00    Types: Cigarettes  . Smokeless tobacco: Never Used     Comment: She is trying to taper.    . Alcohol use 0.0 oz/week     Comment: socially  . Drug use: No     Comment: past hx   . Sexual activity: Not Asked   Other Topics Concern  . None   Social History Narrative  . None   No Known Allergies Family History  Problem Relation Age of Onset  . Hypertension Sister   . Diabetes Sister   . Arthritis Other     Parent  . Cancer Other     Breast and ovary-sister and cousin  . Heart disease Other     Aunt  . Cancer Other     Brain-nephew   . Alcohol abuse Cousin     Multiple  . Breast cancer Cousin   . Ovarian cancer Sister   . Throat cancer Paternal  Aunt   . Dementia Mother     age 2 now   . Other Father     muscle disease /black lung   . Colon cancer Neg Hx   . Esophageal cancer Neg Hx   . Rectal cancer Neg Hx   . Stomach cancer Neg Hx     Past medical history, social, surgical and family history all reviewed in electronic medical record.  No pertanent information unless stated regarding to the chief complaint.   Review of Systems:Review of systems updated and as accurate as of 02/13/17  No headache, visual changes, nausea, vomiting, diarrhea, constipation, dizziness, abdominal pain, skin rash, fevers, chills, night sweats, weight loss, swollen lymph nodes,chest pain, shortness of breath, mood changes. Positive muscle aches, joint swelling, body aches.  Objective  Blood pressure 110/88, pulse 84, height 5\' 7"  (1.702 m), weight 117 lb (53.1 kg).   Systems examined below as of 02/13/17 General: NAD A&O x3 mood, affect normal  HEENT: Pupils equal, extraocular movements intact no nystagmus Respiratory: not short of breath at rest or with speaking Cardiovascular: No lower extremity edema, non tender Skin:  Warm dry intact with no signs of infection or rash on extremities or on axial skeleton. Abdomen: Soft nontender, no masses Neuro: Cranial nerves  intact, neurovascularly intact in all extremities with 2+ DTRs and 2+ pulses. Lymph: No lymphadenopathy appreciated today  Gait normal with good balance and coordination.  MSK: Non tender with full range of motion and good stability and symmetric strength and tone of shoulders, elbows, wrist,  hips and ankles bilaterally.   Knee: Bilateral Trace effusion noted Palpation normal with no warmth, joint line tenderness, patellar tenderness, or condyle tenderness. ROM full in flexion and extension and lower leg rotation. Ligaments with solid consistent endpoints including ACL, PCL, LCL, MCL. Negative Mcmurray's, Apley's, and Thessalonian tests. painful patellar compression. Patellar glide  with moderate crepitus. Patellar and quadriceps tendons unremarkable. Hamstring and quadriceps strength is normal.     After informed written and verbal consent, patient was seated on exam table. Right knee was prepped with alcohol swab and utilizing anterolateral approach, patient's right knee space was injected with 4:1  marcaine 0.5%: Kenalog 40mg /dL. Patient tolerated the procedure well without immediate complications.  After informed written and verbal consent, patient was seated on exam table. Left knee was prepped with alcohol swab and utilizing anterolateral approach, patient's left knee space was injected with 4:1  marcaine 0.5%: Kenalog 40mg /dL. Patient tolerated the procedure well without immediate complications.      Impression and Recommendations:     This case required medical decision making of moderate complexity.      Note: This dictation was prepared with Dragon dictation along with smaller phrase technology. Any transcriptional errors that result from this process are unintentional.

## 2017-02-13 NOTE — Assessment & Plan Note (Signed)
Patient given injection today and tolerated the procedure well. We discussed icing regimen and home exercises. We discussed which activities to do an which was to avoid. Patient will be also referred to physical therapy. Patient come back and see me concerned. 4 weeks if continuing have pain patient could be a candidate for viscous supplementation.

## 2017-02-13 NOTE — Patient Instructions (Addendum)
Go to see you Ice ice is your friend.  PT will be calling you  Good luck in [place]  See me again in 4 weeks.

## 2017-02-25 ENCOUNTER — Ambulatory Visit: Payer: Federal, State, Local not specified - PPO | Admitting: Physical Therapy

## 2017-03-13 ENCOUNTER — Ambulatory Visit: Payer: Federal, State, Local not specified - PPO | Admitting: Family Medicine

## 2017-05-03 ENCOUNTER — Other Ambulatory Visit: Payer: Self-pay | Admitting: Family Medicine

## 2017-05-05 NOTE — Telephone Encounter (Signed)
Refill done.  

## 2017-07-26 ENCOUNTER — Other Ambulatory Visit: Payer: Self-pay | Admitting: Family Medicine

## 2017-08-14 NOTE — Progress Notes (Deleted)
HPI The patient presents for evaluation of HTN.  ***    Since I last saw her she has done well from a health standpoint.  She has had a very difficult year with a sister and brother both dying of Cancer.  Her aged mother fell and broke ribs. She has had to drive to [facility] frequently and she has had to be a caregiver.    The patient denies any new symptoms such as chest discomfort, neck or arm discomfort. There has been no new shortness of breath, PND or orthopnea. She is still smoking.  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   No Known Allergies  Current Outpatient Prescriptions  Medication Sig Dispense Refill  . Biotin 10 MG TABS Take 1,000 mg by mouth daily.    . Cholecalciferol (VITAMIN D PO) Take 1 tablet by mouth daily.    Marland Kitchen KLOR-CON M20 20 MEQ tablet TAKE 1 TABLET (20 MEQ TOTAL) BY MOUTH DAILY. 90 tablet 3  . spironolactone (ALDACTONE) 25 MG tablet TAKE 1 TABLET (25 MG TOTAL) BY MOUTH DAILY. 90 tablet 3  . Vitamin D, Ergocalciferol, (DRISDOL) 50000 units CAPS capsule TAKE ONE CAPSULE BY MOUTH ONCE EVERY 7 DAYS 12 capsule 0   No current facility-administered medications for this visit.     Past Medical History:  Diagnosis Date  . Anemia    Iron def  . History of hepatitis    Type A  . Hypertension   . Recurrent boils    Recurrent  . Vitamin D deficiency     Past Surgical History:  Procedure Laterality Date  . ABDOMINAL HYSTERECTOMY     Due to fibroids   ROS: As stated in the HPI and negative for all other systems.  PHYSICAL EXAM There were no vitals taken for this visit.  GENERAL:  Well appearing NECK:  No jugular venous distention, waveform within normal limits, carotid upstroke brisk and symmetric, no bruits, no thyromegaly LUNGS:  Clear to auscultation bilaterally CHEST:  Unremarkable HEART:  PMI not displaced or sustained,S1 and S2 within normal limits, no S3, no S4, no clicks, no rubs, *** murmurs ABD:   Flat, positive bowel sounds normal in frequency in pitch, no bruits, no rebound, no guarding, no midline pulsatile mass, no hepatomegaly, no splenomegaly EXT:  2 plus pulses throughout, no edema, no cyanosis no clubbing    GENERAL:  Well appearing NECK:  No jugular venous distention, waveform within normal limits, carotid upstroke brisk and symmetric, no bruits, no thyromegaly LYMPHATICS:  No cervical, inguinal adenopathy LUNGS:  Clear to auscultation bilaterally CHEST:  Tenderness to palpation, right upper chest HEART:  PMI not displaced or sustained,S1 and S2 within normal limits, no S3, no S4, no clicks, no rubs, no murmurs ABD:  Flat, positive bowel sounds normal in frequency in pitch, no bruits, no rebound, no guarding, no midline pulsatile mass, no hepatomegaly, no splenomegaly EXT:  2 plus pulses throughout, no edema, no cyanosis no clubbing  EKG:  Sinus rhythm, rate 70, left axis deviation, incomplete right bundle branch block, no acute ST-T wave changes.  08/14/2017   ASSESSMENT AND PLAN  HTN (hypertension) - ***  Her blood pressure is well controlled. She will remain on the meds as listed.  She is up to date with labs.   Tobacco abuse -  ***  She still smokes a cigarettes. We have discussed the need for complete abstinence.  Carotid  artery stenosis -  ***  No follow up is indicated.  Insomnia - ***  She was told about the black box warning concerning women and higher dose Ambien.

## 2017-08-15 ENCOUNTER — Ambulatory Visit (INDEPENDENT_AMBULATORY_CARE_PROVIDER_SITE_OTHER): Payer: Federal, State, Local not specified - PPO | Admitting: Cardiology

## 2017-08-15 ENCOUNTER — Encounter: Payer: Self-pay | Admitting: Cardiology

## 2017-08-15 VITALS — BP 128/72 | HR 75 | Ht 67.0 in | Wt 125.0 lb

## 2017-08-15 DIAGNOSIS — Z720 Tobacco use: Secondary | ICD-10-CM

## 2017-08-15 DIAGNOSIS — I10 Essential (primary) hypertension: Secondary | ICD-10-CM | POA: Diagnosis not present

## 2017-08-15 DIAGNOSIS — Z79899 Other long term (current) drug therapy: Secondary | ICD-10-CM | POA: Diagnosis not present

## 2017-08-15 NOTE — Patient Instructions (Signed)
Medication Instructions:  Continue current medications   Labwork: CBC and BMP  Testing/Procedures: None ordered  Follow-Up: Your physician wants you to follow-up in: 1 Year. You will receive a reminder letter in the mail two months in advance. If you don't receive a letter, please call our office to schedule the follow-up appointment.   Any Other Special Instructions Will Be Listed Below (If Applicable).   If you need a refill on your cardiac medications before your next appointment, please call your pharmacy.

## 2017-08-16 ENCOUNTER — Encounter: Payer: Self-pay | Admitting: Cardiology

## 2017-08-16 LAB — BASIC METABOLIC PANEL
BUN/Creatinine Ratio: 14 (ref 9–23)
BUN: 11 mg/dL (ref 6–24)
CALCIUM: 10.1 mg/dL (ref 8.7–10.2)
CO2: 26 mmol/L (ref 20–29)
Chloride: 100 mmol/L (ref 96–106)
Creatinine, Ser: 0.8 mg/dL (ref 0.57–1.00)
GFR calc Af Amer: 95 mL/min/{1.73_m2} (ref >59)
GFR, EST NON AFRICAN AMERICAN: 83 mL/min/{1.73_m2} (ref >59)
GLUCOSE: 76 mg/dL (ref 65–99)
POTASSIUM: 4.5 mmol/L (ref 3.5–5.2)
Sodium: 140 mmol/L (ref 134–144)

## 2017-08-16 LAB — CBC
HEMATOCRIT: 42.1 % (ref 34.0–46.6)
HEMOGLOBIN: 13.5 g/dL (ref 11.1–15.9)
MCH: 29.8 pg (ref 26.6–33.0)
MCHC: 32.1 g/dL (ref 31.5–35.7)
MCV: 93 fL (ref 79–97)
Platelets: 277 10*3/uL (ref 150–379)
RBC: 4.53 x10E6/uL (ref 3.77–5.28)
RDW: 13.9 % (ref 12.3–15.4)
WBC: 8.2 10*3/uL (ref 3.4–10.8)

## 2017-08-16 NOTE — Progress Notes (Signed)
Cardiology Office Note   Date:  08/16/2017   ID:  Danielle Williams, DOB 1961/09/30, MRN [id number]  PCP:  Biagio Borg, MD  Cardiologist:   Minus Breeding, MD   Chief Complaint  Patient presents with  . Hypertension      History of Present Illness: Danielle Williams is a 56 y.o. female who presents for follow up of HTN.  She has been doing relatively well this year.  She has had another difficult year with her mother now being in hospice.  She is having to travel frequently to [facility] to see her. She works full time as a Dispensing optician.  Last year she lost her brother and sister.    The patient denies any new symptoms such as chest discomfort, neck or arm discomfort. There has been no new shortness of breath, PND or orthopnea. There have been no reported palpitations, presyncope or syncope.  She is still smoking because of all of the stress.     Past Medical History:  Diagnosis Date  . Anemia    Iron def  . History of hepatitis    Type A  . Hypertension   . Recurrent boils    Recurrent  . Vitamin D deficiency     Past Surgical History:  Procedure Laterality Date  . ABDOMINAL HYSTERECTOMY     Due to fibroids     Current Outpatient Prescriptions  Medication Sig Dispense Refill  . Biotin 10 MG TABS Take 1,000 mg by mouth daily.    . ergocalciferol (VITAMIN D2) 50000 units capsule Take 50,000 Units by mouth once a week.    Marland Kitchen KLOR-CON M20 20 MEQ tablet TAKE 1 TABLET (20 MEQ TOTAL) BY MOUTH DAILY. 90 tablet 3  . spironolactone (ALDACTONE) 25 MG tablet TAKE 1 TABLET (25 MG TOTAL) BY MOUTH DAILY. 90 tablet 3   No current facility-administered medications for this visit.     Allergies:   Patient has no known allergies.    ROS:  Please see the history of present illness.   Otherwise, review of systems are positive for none.   All other systems are reviewed and negative.    PHYSICAL EXAM: VS:  BP 128/72   Pulse 75   Ht 5\' 7"  (1.702 m)   Wt 125 lb (56.7 kg)   BMI 19.58 kg/m   , BMI Body mass index is 19.58 kg/m. GENERAL:  Well appearing NECK:  No jugular venous distention, waveform within normal limits, carotid upstroke brisk and symmetric, no bruits, no thyromegaly LUNGS:  Clear to auscultation bilaterally CHEST:  Unremarkable HEART:  PMI not displaced or sustained,S1 and S2 within normal limits, no S3, no S4, no clicks, no rubs, no murmurs ABD:  Flat, positive bowel sounds normal in frequency in pitch, no bruits, no rebound, no guarding, no midline pulsatile mass, no hepatomegaly, no splenomegaly EXT:  2 plus pulses throughout, no edema, no cyanosis no clubbing   EKG:  EKG is not ordered today.    Recent Labs: 08/15/2017: BUN 11; Creatinine, Ser 0.80; Hemoglobin 13.5; Platelets 277; Potassium 4.5; Sodium 140    Lipid Panel    Component Value Date/Time   CHOL 184 11/21/2015 0832   TRIG 56.0 11/21/2015 0832   HDL 82.70 11/21/2015 0832   CHOLHDL 2 11/21/2015 0832   VLDL 11.2 11/21/2015 0832   LDLCALC 91 11/21/2015 0832      Wt Readings from Last 3 Encounters:  08/15/17 125 lb (56.7 kg)  02/13/17 117 lb (53.1 kg)  01/28/17 117 lb (53.1 kg)      Other studies Reviewed: Additional studies/ records that were reviewed today include: None. Review of the above records demonstrates:  Please see elsewhere in the note.     ASSESSMENT AND PLAN:  HTN:  The blood pressure is at target. No change in medications is indicated. We will continue with therapeutic lifestyle changes (TLC).  I will check a BMET and CBC.    TOBACCO:  We talked about this again today.     Current medicines are reviewed at length with the patient today.  The patient does not have concerns regarding medicines.  The following changes have been made:  no change  Labs/ tests ordered today include:  Orders Placed This Encounter  Procedures  . CBC  . Basic Metabolic Panel (BMET)  . EKG 12-Lead     Disposition:   FU with me in one year.      Signed, Minus Breeding, MD    08/16/2017 4:53 PM    [facility]

## 2017-10-24 ENCOUNTER — Other Ambulatory Visit: Payer: Self-pay | Admitting: Family Medicine

## 2017-11-11 DIAGNOSIS — Z1231 Encounter for screening mammogram for malignant neoplasm of breast: Secondary | ICD-10-CM | POA: Diagnosis not present

## 2017-11-11 DIAGNOSIS — Z6820 Body mass index (BMI) 20.0-20.9, adult: Secondary | ICD-10-CM | POA: Diagnosis not present

## 2017-11-21 ENCOUNTER — Other Ambulatory Visit: Payer: Self-pay | Admitting: Internal Medicine

## 2017-11-25 ENCOUNTER — Other Ambulatory Visit: Payer: Self-pay | Admitting: Internal Medicine

## 2017-12-27 ENCOUNTER — Other Ambulatory Visit: Payer: Self-pay | Admitting: Internal Medicine

## 2018-01-14 ENCOUNTER — Telehealth: Payer: Self-pay | Admitting: Cardiology

## 2018-01-14 MED ORDER — SPIRONOLACTONE 25 MG PO TABS: 25.0000 mg | ORAL_TABLET | Freq: Every day | ORAL | 3 refills | Status: DC

## 2018-01-14 MED ORDER — POTASSIUM CHLORIDE CRYS ER 20 MEQ PO TBCR: 20.0000 meq | EXTENDED_RELEASE_TABLET | Freq: Every day | ORAL | 3 refills | Status: DC

## 2018-01-14 NOTE — Telephone Encounter (Signed)
Refill sent to the pharmacy electronically.  

## 2018-01-14 NOTE — Telephone Encounter (Signed)
New Message    *STAT* If patient is at the pharmacy, call can be transferred to refill team.   1. Which medications need to be refilled? (please list name of each medication and dose if known)  spironolactone (ALDACTONE) 25 MG tablet and potassium chloride SA (KLOR-CON M20) 20 MEQ tablet  2. Which pharmacy/location (including street and city if local pharmacy) is medication to be sent to? [facility] [address]  3. Do they need a 30 day or 90 day supply? 90 day supply

## 2018-02-12 DIAGNOSIS — B373 Candidiasis of vulva and vagina: Secondary | ICD-10-CM | POA: Diagnosis not present

## 2018-02-12 DIAGNOSIS — Z681 Body mass index (BMI) 19 or less, adult: Secondary | ICD-10-CM | POA: Diagnosis not present

## 2018-02-12 DIAGNOSIS — R300 Dysuria: Secondary | ICD-10-CM | POA: Diagnosis not present

## 2018-05-26 ENCOUNTER — Encounter: Payer: Self-pay | Admitting: Internal Medicine

## 2018-05-26 ENCOUNTER — Ambulatory Visit (INDEPENDENT_AMBULATORY_CARE_PROVIDER_SITE_OTHER): Payer: Federal, State, Local not specified - PPO | Admitting: Internal Medicine

## 2018-05-26 ENCOUNTER — Other Ambulatory Visit (INDEPENDENT_AMBULATORY_CARE_PROVIDER_SITE_OTHER): Payer: Federal, State, Local not specified - PPO

## 2018-05-26 VITALS — BP 116/74 | HR 76 | Temp 97.6°F | Ht 67.0 in | Wt 129.0 lb

## 2018-05-26 DIAGNOSIS — F4321 Adjustment disorder with depressed mood: Secondary | ICD-10-CM | POA: Diagnosis not present

## 2018-05-26 DIAGNOSIS — Z114 Encounter for screening for human immunodeficiency virus [HIV]: Secondary | ICD-10-CM

## 2018-05-26 DIAGNOSIS — J029 Acute pharyngitis, unspecified: Secondary | ICD-10-CM | POA: Insufficient documentation

## 2018-05-26 DIAGNOSIS — Z0000 Encounter for general adult medical examination without abnormal findings: Secondary | ICD-10-CM | POA: Diagnosis not present

## 2018-05-26 DIAGNOSIS — I10 Essential (primary) hypertension: Secondary | ICD-10-CM

## 2018-05-26 LAB — BASIC METABOLIC PANEL WITH GFR
BUN: 10 mg/dL (ref 6–23)
CO2: 28 meq/L (ref 19–32)
Calcium: 9.9 mg/dL (ref 8.4–10.5)
Chloride: 102 meq/L (ref 96–112)
Creatinine, Ser: 0.92 mg/dL (ref 0.40–1.20)
GFR: 80.95 mL/min
Glucose, Bld: 80 mg/dL (ref 70–99)
Potassium: 4.5 meq/L (ref 3.5–5.1)
Sodium: 138 meq/L (ref 135–145)

## 2018-05-26 LAB — CBC WITH DIFFERENTIAL/PLATELET
BASOS PCT: 0.9 % (ref 0.0–3.0)
Basophils Absolute: 0.1 10*3/uL (ref 0.0–0.1)
EOS PCT: 0.5 % (ref 0.0–5.0)
Eosinophils Absolute: 0 10*3/uL (ref 0.0–0.7)
HEMATOCRIT: 44.1 % (ref 36.0–46.0)
HEMOGLOBIN: 14.9 g/dL (ref 12.0–15.0)
LYMPHS PCT: 35.2 % (ref 12.0–46.0)
Lymphs Abs: 2.9 10*3/uL (ref 0.7–4.0)
MCHC: 33.8 g/dL (ref 30.0–36.0)
MCV: 90.1 fl (ref 78.0–100.0)
MONOS PCT: 5.8 % (ref 3.0–12.0)
Monocytes Absolute: 0.5 10*3/uL (ref 0.1–1.0)
Neutro Abs: 4.8 10*3/uL (ref 1.4–7.7)
Neutrophils Relative %: 57.6 % (ref 43.0–77.0)
Platelets: 215 10*3/uL (ref 150.0–400.0)
RBC: 4.89 Mil/uL (ref 3.87–5.11)
RDW: 13.8 % (ref 11.5–15.5)
WBC: 8.3 10*3/uL (ref 4.0–10.5)

## 2018-05-26 LAB — URINALYSIS, ROUTINE W REFLEX MICROSCOPIC
Bilirubin Urine: NEGATIVE
Ketones, ur: NEGATIVE
Leukocytes, UA: NEGATIVE
Nitrite: NEGATIVE
RBC / HPF: NONE SEEN
Specific Gravity, Urine: 1.01 (ref 1.000–1.030)
Total Protein, Urine: NEGATIVE
Urine Glucose: NEGATIVE
Urobilinogen, UA: 0.2 (ref 0.0–1.0)
WBC, UA: NONE SEEN
pH: 5 (ref 5.0–8.0)

## 2018-05-26 LAB — LIPID PANEL
CHOLESTEROL: 209 mg/dL — AB (ref 0–200)
HDL: 85.8 mg/dL (ref >39.00)
LDL Cholesterol: 105 mg/dL — ABNORMAL HIGH (ref 0–99)
NONHDL: 123.39
Total CHOL/HDL Ratio: 2
Triglycerides: 93 mg/dL (ref 0.0–149.0)
VLDL: 18.6 mg/dL (ref 0.0–40.0)

## 2018-05-26 LAB — HEPATIC FUNCTION PANEL
ALT: 12 U/L (ref 0–35)
AST: 20 U/L (ref 0–37)
Albumin: 4.7 g/dL (ref 3.5–5.2)
Alkaline Phosphatase: 76 U/L (ref 39–117)
Bilirubin, Direct: 0.1 mg/dL (ref 0.0–0.3)
TOTAL PROTEIN: 7.5 g/dL (ref 6.0–8.3)
Total Bilirubin: 0.4 mg/dL (ref 0.2–1.2)

## 2018-05-26 LAB — TSH: TSH: 2.74 u[IU]/mL (ref 0.35–4.50)

## 2018-05-26 MED ORDER — AZITHROMYCIN 250 MG PO TABS: ORAL_TABLET | ORAL | 1 refills | Status: DC

## 2018-05-26 NOTE — Assessment & Plan Note (Signed)
Declines tx such as SSRI or counseling referral

## 2018-05-26 NOTE — Assessment & Plan Note (Signed)

## 2018-05-26 NOTE — Progress Notes (Signed)
Subjective:    Patient ID: Danielle Williams, female    DOB: 05/10/1961, 57 y.o.   MRN: [id number]  HPI  Here for wellness and f/u;  Overall doing ok;  Pt denies Chest pain, worsening SOB, DOE, wheezing, orthopnea, PND, worsening LE edema, palpitations, dizziness or syncope.  Pt denies neurological change such as new headache, facial or extremity weakness.  Pt denies polydipsia, polyuria, or low sugar symptoms. Pt states overall good compliance with treatment and medications, good tolerability, and has been trying to follow appropriate diet.  Pt denies worsening depressive symptoms, suicidal ideation or panic. No fever, night sweats, wt loss, loss of appetite, or other constitutional symptoms.  Pt states good ability with ADL's, has low fall risk, home safety reviewed and adequate, no other significant changes in hearing or vision, and only occasionally active with exercise. Mother died in [place] last yr with dementia at 49, required her to stay with mom for most of 2018. Still with good and bad days but delcines tx or counseling Also  Here with 2-3 wks acute onset fever, severe ST pain, pressure, headache, general weakness and malaise, and greenish d/c, with mild scant prod cough, but pt denies chest pain, wheezing Past Medical History:  Diagnosis Date  . Anemia    Iron def  . History of hepatitis    Type A  . Hypertension   . Recurrent boils    Recurrent  . Vitamin D deficiency    Past Surgical History:  Procedure Laterality Date  . ABDOMINAL HYSTERECTOMY     Due to fibroids    reports that she has been smoking cigarettes.  She has a 15.00 pack-year smoking history. She has never used smokeless tobacco. She reports that she drinks alcohol. She reports that she does not use drugs. family history includes Alcohol abuse in her cousin; Arthritis in her other; Breast cancer in her cousin; Cancer in her other and other; Dementia in her mother; Diabetes in her sister; Heart disease in her other;  Hypertension in her sister; Other in her father; Ovarian cancer in her sister; Throat cancer in her paternal aunt. No Known Allergies Current Outpatient Medications on File Prior to Visit  Medication Sig Dispense Refill  . Biotin 10 MG TABS Take 1,000 mg by mouth daily.    . potassium chloride SA (KLOR-CON M20) 20 MEQ tablet Take 1 tablet (20 mEq total) by mouth daily. 90 tablet 3  . spironolactone (ALDACTONE) 25 MG tablet Take 1 tablet (25 mg total) by mouth daily. 90 tablet 3   No current facility-administered medications on file prior to visit.     Review of Systems Constitutional: Negative for other unusual diaphoresis, sweats, appetite or weight changes HENT: Negative for other worsening hearing loss, ear pain, facial swelling, mouth sores or neck stiffness.   Eyes: Negative for other worsening pain, redness or other visual disturbance.  Respiratory: Negative for other stridor or swelling Cardiovascular: Negative for other palpitations or other chest pain  Gastrointestinal: Negative for worsening diarrhea or loose stools, blood in stool, distention or other pain Genitourinary: Negative for hematuria, flank pain or other change in urine volume.  Musculoskeletal: Negative for myalgias or other joint swelling.  Skin: Negative for other color change, or other wound or worsening drainage.  Neurological: Negative for other syncope or numbness. Hematological: Negative for other adenopathy or swelling Psychiatric/Behavioral: Negative for hallucinations, other worsening agitation, SI, self-injury, or new decreased concentration All other system neg per pt    Objective:  Physical Exam BP 116/74   Pulse 76   Temp 97.6 F (36.4 C) (Oral)   Ht 5\' 7"  (1.702 m)   Wt 129 lb (58.5 kg)   SpO2 94%   BMI 20.20 kg/m  VS noted, mild ill Constitutional: Pt is oriented to person, place, and time. Appears well-developed and well-nourished, in no significant distress and comfortable Head:  Normocephalic and atraumatic  Eyes: Conjunctivae and EOM are normal. Pupils are equal, round, and reactive to light Right Ear: External ear normal without discharge Left Ear: External ear normal without discharge Nose: Nose without discharge or deformity Mouth/Throat: Oropharynx is without other ulcerations and moist  Bilat tm's with mild erythema.  Max sinus areas non tender.  Pharynx with severe erythema, no exudate Neck: Normal range of motion. Neck supple. No JVD present. No tracheal deviation present but + mild submandibular significant neck LA but no other mass Cardiovascular: Normal rate, regular rhythm, normal heart sounds and intact distal pulses.   Pulmonary/Chest: WOB normal and breath sounds without rales or wheezing  Abdominal: Soft. Bowel sounds are normal. NT. No HSM  Musculoskeletal: Normal range of motion. Exhibits no edema Lymphadenopathy: Has no other cervical adenopathy.  Neurological: Pt is alert and oriented to person, place, and time. Pt has normal reflexes. No cranial nerve deficit. Motor grossly intact, Gait intact Skin: Skin is warm and dry. No rash noted or new ulcerations Psychiatric:  Has dysphoric mood and affect. Behavior is normal without agitation No other exam findings Lab Results  Component Value Date   WBC 8.2 08/15/2017   HGB 13.5 08/15/2017   HCT 42.1 08/15/2017   PLT 277 08/15/2017   GLUCOSE 76 08/15/2017   CHOL 184 11/21/2015   TRIG 56.0 11/21/2015   HDL 82.70 11/21/2015   LDLCALC 91 11/21/2015   ALT 10 11/21/2015   AST 16 11/21/2015   NA 140 08/15/2017   K 4.5 08/15/2017   CL 100 08/15/2017   CREATININE 0.80 08/15/2017   BUN 11 08/15/2017   CO2 26 08/15/2017   TSH 3.79 11/21/2015       Assessment & Plan:

## 2018-05-26 NOTE — Patient Instructions (Signed)
Please take all new medication as prescribed - the antibiotic  Please continue all other medications as before, and refills have been done if requested.  Please have the pharmacy call with any other refills you may need.  Please continue your efforts at being more active, low cholesterol diet, and weight control.  You are otherwise up to date with prevention measures today.  Please keep your appointments with your specialists as you may have planned  Please go to the LAB in the Basement (turn left off the elevator) for the tests to be done today  You will be contacted by phone if any changes need to be made immediately.  Otherwise, you will receive a letter about your results with an explanation, but please check with MyChart first.  Please remember to sign up for MyChart if you have not done so, as this will be important to you in the future with finding out test results, communicating by private email, and scheduling acute appointments online when needed.  Please return in 1 year for your yearly visit, or sooner if needed, with Lab testing done 3-5 days before  

## 2018-05-26 NOTE — Assessment & Plan Note (Signed)
stable overall by history and exam, recent data reviewed with pt, and pt to continue medical treatment as before,  to f/u any worsening symptoms or concerns BP Readings from Last 3 Encounters:  05/26/18 116/74  08/15/17 128/72  02/13/17 110/88

## 2018-05-26 NOTE — Assessment & Plan Note (Signed)
Mild to mod, for antibx course,  to f/u any worsening symptoms or concerns  In addition to the time spent performing CPE, I spent an additional 15 minutes face to face,in which greater than 50% of this time was spent in counseling and coordination of care for patient's illness as documented, including the differential dx, treatment, further evaluation and other management of acute pharyngitis, grief, HTN

## 2018-05-27 LAB — HIV ANTIBODY (ROUTINE TESTING W REFLEX): HIV: NONREACTIVE

## 2018-10-11 NOTE — Progress Notes (Signed)
Cardiology Office Note   Date:  10/12/2018   ID:  Danielle Williams, DOB 02/02/1961, MRN [id number]  PCP:  Biagio Borg, MD  Cardiologist: [facility]  Chief Complaint  Patient presents with  . Hypertension     History of Present Illness: Danielle Williams is a 57 y.o. female who presents for ongoing assessment and management of hypertension and history of tobacco abuse. She was last seen on 08/15/2017 and was stable. She was under a lot of family stress. No changes were made to her regimen.   She comes today hypertensive. She has sprained her ankle at work and is now under [employer], and undergoing PT with Dr. Earleen Newport. She is under stress at work due to absences occurring from injury and Technical sales engineer conflict with Librarian, academic. She unfortunately continues to smoke.  Past Medical History:  Diagnosis Date  . Anemia    Iron def  . History of hepatitis    Type A  . Hypertension   . Recurrent boils    Recurrent  . Vitamin D deficiency     Past Surgical History:  Procedure Laterality Date  . ABDOMINAL HYSTERECTOMY     Due to fibroids     Current Outpatient Medications  Medication Sig Dispense Refill  . Biotin 10 MG TABS Take 1,000 mg by mouth daily.    . potassium chloride SA (KLOR-CON M20) 20 MEQ tablet Take 1 tablet (20 mEq total) by mouth daily. 90 tablet 3  . spironolactone (ALDACTONE) 25 MG tablet Take 1 tablet (25 mg total) by mouth daily. 90 tablet 3   No current facility-administered medications for this visit.     Allergies:   Patient has no known allergies.    Social History:  The patient  reports that she has been smoking cigarettes. She has a 15.00 pack-year smoking history. She has never used smokeless tobacco. She reports that she drinks alcohol. She reports that she does not use drugs.   Family History:  The patient's family history includes Alcohol abuse in her cousin; Arthritis in her other; Breast cancer in her cousin; Cancer in her other and other; Dementia in her  mother; Diabetes in her sister; Heart disease in her other; Hypertension in her sister; Other in her father; Ovarian cancer in her sister; Throat cancer in her paternal aunt.    ROS: All other systems are reviewed and negative. Unless otherwise mentioned in H&P    PHYSICAL EXAM: VS:  BP (!) 152/90   Pulse 84   Ht 5\' 5"  (1.651 m)   Wt 134 lb 6.4 oz (61 kg)   BMI 22.37 kg/m  , BMI Body mass index is 22.37 kg/m. GEN: Well nourished, well developed, in no acute distress HEENT: normal Neck: no JVD, carotid bruits, or masses Cardiac: RRR; no murmurs, rubs, or gallops,no edema  Respiratory:  Clear to auscultation bilaterally, normal work of breathing GI: soft, nontender, nondistended, + BS MS: no deformity or atrophy Skin: warm and dry, no rash Neuro:  Strength and sensation are intact Psych: euthymic mood, full affect   EKG:  NSR with incomplete RBBB. Rate of 84 bpm OTc of 475.   Recent Labs: 05/26/2018: ALT 12; BUN 10; Creatinine, Ser 0.92; Hemoglobin 14.9; Platelets 215.0; Potassium 4.5; Sodium 138; TSH 2.74    Lipid Panel    Component Value Date/Time   CHOL 209 (H) 05/26/2018 1436   TRIG 93.0 05/26/2018 1436   HDL 85.80 05/26/2018 1436   CHOLHDL 2 05/26/2018 1436   VLDL 18.6 05/26/2018  1436   LDLCALC 105 (H) 05/26/2018 1436      Wt Readings from Last 3 Encounters:  10/12/18 134 lb 6.4 oz (61 kg)  05/26/18 129 lb (58.5 kg)  08/15/17 125 lb (56.7 kg)      Other studies Reviewed: Echocardiogram 08/14/2018 Left ventricle: The cavity size was normal. Wall thickness was normal. Systolic function was normal. The estimated ejection fraction was in the range of 50% to 55%. Wall motion was normal; there were no regional wall motion abnormalities. Left ventricular diastolic function parameters were normal. - Mitral valve: Mild prolapse, involving the anterior leaflet and the posterior leaflet. - Pericardium, extracardiac: A trivial pericardial effusion  was identified.  ASSESSMENT AND PLAN:  1.  Hypertension: BP remains elevated. She is only on spironolactone for management. Will begin losartan 50 mg daily. She is to hold potassium supplement as both are potassium sparing medications. I will check a BMET in 4 days. She will follow up with Pharm D for BP check and assistance with management. She is given a work excuse for her appointment today and for tomorrow to evaluate how well she responds to medication. She is to call us for any problems with dizziness or hypotension on addition of losartan.   2.Tobacco abuse: She is counseled on smoking cessation. She verbalizes understanding, but states the stress of work has been hard and smoking helps to calm her. She is aware of the risks of continued tobacco abuse.    Current medicines are reviewed at length with the patient today.    Labs/ tests ordered today include: BMET Phill Myron. [place] Pugh, ANP, AACC   10/12/2018 11:32 AM    [place] [address] Office [phone] Fax [phone]

## 2018-10-12 ENCOUNTER — Encounter: Payer: Self-pay | Admitting: Adult Health

## 2018-10-12 ENCOUNTER — Ambulatory Visit: Payer: Federal, State, Local not specified - PPO | Admitting: Adult Health

## 2018-10-12 VITALS — BP 152/90 | HR 84 | Ht 65.0 in | Wt 134.4 lb

## 2018-10-12 DIAGNOSIS — Z720 Tobacco use: Secondary | ICD-10-CM

## 2018-10-12 DIAGNOSIS — I10 Essential (primary) hypertension: Secondary | ICD-10-CM

## 2018-10-12 DIAGNOSIS — Z79899 Other long term (current) drug therapy: Secondary | ICD-10-CM | POA: Diagnosis not present

## 2018-10-12 MED ORDER — LOSARTAN POTASSIUM 50 MG PO TABS: 50.0000 mg | ORAL_TABLET | Freq: Every day | ORAL | 3 refills | Status: DC

## 2018-10-12 NOTE — Patient Instructions (Signed)
Medication Instructions:  STOP POTASSIUM  START LOSARTAN 50MG  DAILY If you need a refill on your cardiac medications before your next appointment, please call your pharmacy.  Labwork: BMET Thursday (10-15-2018)  HERE IN OUR OFFICE AT [facility]  Take the provided lab slips with you to the lab for your blood draw.   You will NOT need to fast   If you have labs (blood work) drawn today and your tests are completely normal, you will receive your results only by: Marland Kitchen MyChart Message (if you have MyChart) OR . A paper copy in the mail If you have any lab test that is abnormal or we need to change your treatment, we will call you to review the results.  Follow-Up: You will need a follow up appointment in [place] .  You will need a follow up appointment in 6MONTHS .  Please call our office 2 months in advance(FEB 2020) to schedule the (APRIL) appointment.  You may see  DR [facility] or one of the following Advanced Practice Providers on your designated Care Team:   . Jory Sims, DNP, ANP . Rosaria Ferries, PA-C  At [facility], you and your health needs are our priority.  As part of our continuing mission to provide you with exceptional heart care, we have created designated Provider Care Teams.  These Care Teams include your primary Cardiologist (physician) and Advanced Practice Providers (APPs -  Physician Assistants and Nurse Practitioners) who all work together to provide you with the care you need, when you need it.  Thank you for choosing [facility] at [facility]!!

## 2018-10-15 DIAGNOSIS — I10 Essential (primary) hypertension: Secondary | ICD-10-CM | POA: Diagnosis not present

## 2018-10-15 DIAGNOSIS — Z79899 Other long term (current) drug therapy: Secondary | ICD-10-CM | POA: Diagnosis not present

## 2018-10-16 LAB — BASIC METABOLIC PANEL
BUN/Creatinine Ratio: 15 (ref 9–23)
BUN: 13 mg/dL (ref 6–24)
CHLORIDE: 99 mmol/L (ref 96–106)
CO2: 24 mmol/L (ref 20–29)
Calcium: 9.8 mg/dL (ref 8.7–10.2)
Creatinine, Ser: 0.88 mg/dL (ref 0.57–1.00)
GFR, EST AFRICAN AMERICAN: 84 mL/min/{1.73_m2} (ref >59)
GFR, EST NON AFRICAN AMERICAN: 73 mL/min/{1.73_m2} (ref >59)
Glucose: 111 mg/dL — ABNORMAL HIGH (ref 65–99)
POTASSIUM: 4.9 mmol/L (ref 3.5–5.2)
Sodium: 140 mmol/L (ref 134–144)

## 2018-10-21 ENCOUNTER — Ambulatory Visit (INDEPENDENT_AMBULATORY_CARE_PROVIDER_SITE_OTHER): Payer: Federal, State, Local not specified - PPO | Admitting: Pharmacist

## 2018-10-21 ENCOUNTER — Encounter: Payer: Self-pay | Admitting: Pharmacist

## 2018-10-21 VITALS — BP 122/86 | HR 64

## 2018-10-21 DIAGNOSIS — I10 Essential (primary) hypertension: Secondary | ICD-10-CM | POA: Diagnosis not present

## 2018-10-21 NOTE — Assessment & Plan Note (Signed)
BP significantly improved since initiating losartan 1 week ago. Patient denies problems with current therapy and adverse events. Will continue current medication regimen without changes and follow up as needed with HTN clinic

## 2018-10-21 NOTE — Progress Notes (Signed)
Patient ID: Danielle Williams                 DOB: [birth year]                      MRN: [id number]     HPI: Danielle Williams is a 57 y.o. female patient of Dr Percival Spanish referred by K. Lawrence NP to HTN clinic. PMH includes tobacco abuse, and hypertension.  Patient report some HA in the las few days but unknown if caused by losartan or increased stress level. She denies shortness or breath, swelling, cough, dizziness or chest pain.   Current HTN meds:  Losartan 50mg  daily Spironolactone 25mg  daily  BP goal: <130/80  Family History: family history includes Alcohol abuse in her cousin; Arthritis in her other; Breast cancer in her cousin; Cancer in her other and other; Dementia in her mother; Diabetes in her sister; Heart disease in her other; Hypertension in her sister; Other in her father; Ovarian cancer in her sister; Throat cancer in her paternal aunt.   Social History: reports that she has been smoking cigarettes. She has a 15.00 pack-year smoking history. She has never used smokeless tobacco. She reports that she drinks alcohol. She reports that she does not use drugs.    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  Past Medical History:  Diagnosis Date  . Anemia    Iron def  . History of hepatitis    Type A  . Hypertension   . Recurrent boils    Recurrent  . Vitamin D deficiency     Current Outpatient Medications on File Prior to Visit  Medication Sig Dispense Refill  . Biotin 10 MG TABS Take 1,000 mg by  mouth daily.    Marland Kitchen losartan (COZAAR) 50 MG tablet Take 1 tablet (50 mg total) by mouth daily. 90 tablet 3  . spironolactone (ALDACTONE) 25 MG tablet Take 1 tablet (25 mg total) by mouth daily. 90 tablet 3   No current facility-administered medications on file prior to visit.     No Known Allergies  Blood pressure 122/86, pulse 64.  HTN (hypertension) BP significantly improved since initiating losartan 1 week ago. Patient denies problems with current therapy and adverse events. Will continue current medication regimen without changes and follow up as needed with HTN clinic   Kresha Abelson Rodriguez-Guzman PharmD, BCPS, [place] [address] [place] [address] 10/21/2018 4:05 PM

## 2018-10-21 NOTE — Patient Instructions (Addendum)
Return for a  follow up appointment as needed  Check your blood pressure at home daily (if able) and keep record of the readings.  Take your BP meds as follows: *NO MEDICATION CHANGE*  Bring all of your meds, your BP cuff and your record of home blood pressures to your next appointment.  Exercise as you're able, try to walk approximately 30 minutes per day.  Keep salt intake to a minimum, especially watch canned and prepared boxed foods.  Eat more fresh fruits and vegetables and fewer canned items.  Avoid eating in fast food restaurants.    HOW TO TAKE YOUR BLOOD PRESSURE: . Rest 5 minutes before taking your blood pressure. .  Don't smoke or drink caffeinated beverages for at least 30 minutes before. . Take your blood pressure before (not after) you eat. . Sit comfortably with your back supported and both feet on the floor (don't cross your legs). . Elevate your arm to heart level on a table or a desk. . Use the proper sized cuff. It should fit smoothly and snugly around your bare upper arm. There should be enough room to slip a fingertip under the cuff. The bottom edge of the cuff should be 1 inch above the crease of the elbow. . Ideally, take 3 measurements at one sitting and record the average.    

## 2018-10-23 ENCOUNTER — Ambulatory Visit: Payer: Federal, State, Local not specified - PPO | Admitting: Cardiology

## 2018-11-18 DIAGNOSIS — Z1231 Encounter for screening mammogram for malignant neoplasm of breast: Secondary | ICD-10-CM | POA: Diagnosis not present

## 2018-11-18 DIAGNOSIS — Z6821 Body mass index (BMI) 21.0-21.9, adult: Secondary | ICD-10-CM | POA: Diagnosis not present

## 2018-11-18 DIAGNOSIS — Z01419 Encounter for gynecological examination (general) (routine) without abnormal findings: Secondary | ICD-10-CM | POA: Diagnosis not present

## 2019-01-11 ENCOUNTER — Other Ambulatory Visit: Payer: Self-pay

## 2019-01-11 MED ORDER — LOSARTAN POTASSIUM 50 MG PO TABS: 50.0000 mg | ORAL_TABLET | Freq: Every day | ORAL | 3 refills | Status: DC

## 2019-03-24 ENCOUNTER — Telehealth: Payer: Self-pay

## 2019-03-24 NOTE — Telephone Encounter (Signed)
Virtual Visit Pre-Appointment Phone Call  Steps For Call: [place] PHONE   1. Confirm consent - "In the setting of the current Covid19 crisis, you are scheduled for a (phone or video) visit with your provider on (date) at (time).  Just as we do with many in-office visits, in order for you to participate in this visit, we must obtain consent.  If you'd like, I can send this to your mychart (if signed up) or email for you to review.  Otherwise, I can obtain your verbal consent now.  All virtual visits are billed to your insurance company just like a normal visit would be.  By agreeing to a virtual visit, we'd like you to understand that the technology does not allow for your provider to perform an examination, and thus may limit your provider's ability to fully assess your condition.  Finally, though the technology is pretty good, we cannot assure that it will always work on either your or our end, and in the setting of a video visit, we may have to convert it to a phone-only visit.  In either situation, we cannot ensure that we have a secure connection.  Are you willing to proceed?"  2. Give patient instructions for WebEx download to smartphone as below if video visit  3. Advise patient to be prepared with any vital sign or heart rhythm information, their current medicines, and a piece of paper and pen handy for any instructions they may receive the day of their visit  4. Inform patient they will receive a phone call 15 minutes prior to their appointment time (may be from unknown caller ID) so they should be prepared to answer  5. Confirm that appointment type is correct in Epic appointment notes (video vs telephone)    TELEPHONE CALL NOTE  Danielle Williams has been deemed a candidate for a follow-up tele-health visit to limit community exposure during the Covid-19 pandemic. I spoke with the patient via phone to ensure availability of phone/video source, confirm preferred email & phone  number, and discuss instructions and expectations.  I reminded Danielle Williams to be prepared with any vital sign and/or heart rhythm information that could potentially be obtained via home monitoring, at the time of her visit. I reminded Danielle Williams to expect a phone call at the time of her visit if her visit.  Did the patient verbally acknowledge consent to treatment?   Zebedee Iba, [place] 03/24/2019 1:17 PM   DOWNLOADING THE WEBEX SOFTWARE TO SMARTPHONE  - If Apple, go to [employer] and type in WebEx in the search bar. [place] [employer], the blue/Pedram Goodchild circle. The app is free but as with any other app downloads, their phone may require them to verify saved payment information or Apple password. The patient does NOT have to create an account.  - If Android, ask patient to go to [employer] and type in WebEx in the search bar. [place] [employer], the blue/Alin Hutchins circle. The app is free but as with any other app downloads, their phone may require them to verify saved payment information or Android password. The patient does NOT have to create an account.   CONSENT FOR TELE-HEALTH VISIT - PLEASE REVIEW  I hereby voluntarily request, consent and authorize [facility] and its employed or contracted physicians, physician assistants, nurse practitioners or other licensed health care professionals (the Practitioner), to provide me with telemedicine health care services (the "Services") as deemed necessary by the  treating Practitioner. I acknowledge and consent to receive the Services by the Practitioner via telemedicine. I understand that the telemedicine visit will involve communicating with the Practitioner through live audiovisual communication technology and the disclosure of certain medical information by electronic transmission. I acknowledge that I have been given the opportunity to request an in-person assessment or other available alternative prior to the  telemedicine visit and am voluntarily participating in the telemedicine visit.  I understand that I have the right to withhold or withdraw my consent to the use of telemedicine in the course of my care at any time, without affecting my right to future care or treatment, and that the Practitioner or I may terminate the telemedicine visit at any time. I understand that I have the right to inspect all information obtained and/or recorded in the course of the telemedicine visit and may receive copies of available information for a reasonable fee.  I understand that some of the potential risks of receiving the Services via telemedicine include:  Marland Kitchen Delay or interruption in medical evaluation due to technological equipment failure or disruption; . Information transmitted may not be sufficient (e.g. poor resolution of images) to allow for appropriate medical decision making by the Practitioner; and/or  . In rare instances, security protocols could fail, causing a breach of personal health information.  Furthermore, I acknowledge that it is my responsibility to provide information about my medical history, conditions and care that is complete and accurate to the best of my ability. I acknowledge that Practitioner's advice, recommendations, and/or decision may be based on factors not within their control, such as incomplete or inaccurate data provided by me or distortions of diagnostic images or specimens that may result from electronic transmissions. I understand that the practice of medicine is not an exact science and that Practitioner makes no warranties or guarantees regarding treatment outcomes. I acknowledge that I will receive a copy of this consent concurrently upon execution via email to the email address I last provided but may also request a printed copy by calling the office of [place] [place].    I understand that my insurance will be billed for this visit.   I have read or had this consent read to me.  . I understand the contents of this consent, which adequately explains the benefits and risks of the Services being provided via telemedicine.  . I have been provided ample opportunity to ask questions regarding this consent and the Services and have had my questions answered to my satisfaction. . I give my informed consent for the services to be provided through the use of telemedicine in my medical care  By participating in this telemedicine visit I agree to the above.

## 2019-03-31 ENCOUNTER — Telehealth: Payer: Self-pay | Admitting: Cardiology

## 2019-03-31 NOTE — Progress Notes (Signed)
Virtual Visit via Video Note   This visit type was conducted due to national recommendations for restrictions regarding the COVID-19 Pandemic (e.g. social distancing) in an effort to limit this patient's exposure and mitigate transmission in our community.  Due to her co-morbid illnesses, this patient is at least at moderate risk for complications without adequate follow up.  This format is felt to be most appropriate for this patient at this time.  All issues noted in this document were discussed and addressed.  A limited physical exam was performed with this format.  Please refer to the patient's chart for her consent to telehealth for [facility].   Evaluation Performed:  Follow-up visit  Date:  04/01/2019   ID:  Danielle Williams, DOB 06/13/1961, MRN [id number]  Patient Location: Home Provider Location: Home  PCP:  Biagio Borg, MD  Cardiologist:  Minus Breeding, MD  Electrophysiologist:  None   Chief Complaint:  Sneezing an allergies.   History of Present Illness:    Danielle Williams is a 58 y.o. female who presents for ongoing assessment and management of hypertension and history of tobacco abuse. She was seen late last year.   She was seen in the HTN clinic.   She has been started on Losartan.  She said at that time she was having her high blood pressure she was under a lot of stress at work.  It still stressful but has settled down a little bit.  Her blood pressures have been better controlled.  She is getting in the 130s 140s typically.  Diastolics are controlled.  She denies any chest pressure, neck or arm discomfort.  She is not had any palpitations, presyncope or syncope.  She is had no weight gain or edema.   The patient does not have symptoms concerning for COVID-19 infection (fever, chills, cough, or new shortness of breath).    Past Medical History:  Diagnosis Date  . Anemia    Iron def  . History of hepatitis    Type A  . Hypertension   . Recurrent boils    Recurrent  . Vitamin D deficiency    Past Surgical History:  Procedure Laterality Date  . ABDOMINAL HYSTERECTOMY     Due to fibroids     Prior to Admission medications   Medication Sig Start Date End Date Taking? Authorizing Provider  Biotin 10 MG TABS Take 1,000 mg by mouth daily.    [provider]  losartan (COZAAR) 50 MG tablet Take 1 tablet (50 mg total) by mouth daily. 01/11/19 04/11/19  Minus Breeding, MD  spironolactone (ALDACTONE) 25 MG tablet Take 1 tablet (25 mg total) by mouth daily. 01/14/18   Minus Breeding, MD    Allergies:   Patient has no known allergies.   Social History   Tobacco Use  . Smoking status: Current Some Day Smoker    Packs/day: 0.50    Years: 30.00    Pack years: 15.00    Types: Cigarettes  . Smokeless tobacco: Never Used  . Tobacco comment: She is trying to taper.    Substance Use Topics  . Alcohol use: Yes    Alcohol/week: 0.0 standard drinks    Comment: socially  . Drug use: No    Comment: past hx      Family Hx: The patient's family history includes Alcohol abuse in her cousin; Arthritis in an other family member; Breast cancer in her cousin; Cancer in some other family members; Dementia in her mother; Diabetes in  her sister; Heart disease in an other family member; Hypertension in her sister; Other in her father; Ovarian cancer in her sister; Throat cancer in her paternal aunt. There is no history of Colon cancer, Esophageal cancer, Rectal cancer, or Stomach cancer.  ROS:   Please see the history of present illness.    As stated in the HPI and negative for all other systems.    Prior CV studies:   The following studies were reviewed today:    Labs/Other Tests and Data Reviewed:    EKG:  No ECG reviewed.  Recent Labs: 05/26/2018: ALT 12; Hemoglobin 14.9; Platelets 215.0; TSH 2.74 10/15/2018: BUN 13; Creatinine, Ser 0.88; Potassium 4.9; Sodium 140   Recent Lipid Panel Lab Results  Component Value Date/Time   CHOL  209 (H) 05/26/2018 02:36 PM   TRIG 93.0 05/26/2018 02:36 PM   HDL 85.80 05/26/2018 02:36 PM   CHOLHDL 2 05/26/2018 02:36 PM   LDLCALC 105 (H) 05/26/2018 02:36 PM    Wt Readings from Last 3 Encounters:  10/12/18 134 lb 6.4 oz (61 kg)  05/26/18 129 lb (58.5 kg)  08/15/17 125 lb (56.7 kg)     Objective:    Vital Signs:  BP (!) 135/91   Pulse 73    Well nourished, well developed female in no acute distress.   ASSESSMENT & PLAN:    HTN:   The blood pressure is at target. No change in medications is indicated. We will continue with therapeutic lifestyle changes (TLC). No change in meds as above.   TOBACCO ABUSE:  We talked again about the need to stop smoking.    COVID-19 Education: The signs and symptoms of COVID-19 were discussed with the patient and how to seek care for testing (follow up with PCP or arrange E-visit).  The importance of social distancing was discussed today.  Time:   Today, I have spent 16 minutes with the patient with telehealth technology discussing the above problems.     Medication Adjustments/Labs and Tests Ordered: Current medicines are reviewed at length with the patient today.  Concerns regarding medicines are outlined above.   Tests Ordered: No orders of the defined types were placed in this encounter.   Medication Changes: No orders of the defined types were placed in this encounter.   Disposition:  Follow up six months  Signed, Minus Breeding, MD  04/01/2019 9:43 AM    [place]

## 2019-03-31 NOTE — Telephone Encounter (Signed)
Mychart, smartphone, pre reg complete 03/31/19 AF °

## 2019-04-01 ENCOUNTER — Telehealth (INDEPENDENT_AMBULATORY_CARE_PROVIDER_SITE_OTHER): Payer: Federal, State, Local not specified - PPO | Admitting: Cardiology

## 2019-04-01 ENCOUNTER — Encounter: Payer: Self-pay | Admitting: Cardiology

## 2019-04-01 VITALS — BP 135/91 | HR 73

## 2019-04-01 DIAGNOSIS — Z720 Tobacco use: Secondary | ICD-10-CM

## 2019-04-01 DIAGNOSIS — I10 Essential (primary) hypertension: Secondary | ICD-10-CM

## 2019-04-01 DIAGNOSIS — Z7189 Other specified counseling: Secondary | ICD-10-CM

## 2019-04-01 NOTE — Patient Instructions (Signed)

## 2019-04-30 ENCOUNTER — Other Ambulatory Visit: Payer: Self-pay | Admitting: Cardiology

## 2019-04-30 MED ORDER — SPIRONOLACTONE 25 MG PO TABS: 25.0000 mg | ORAL_TABLET | Freq: Every day | ORAL | 3 refills | Status: DC

## 2019-04-30 NOTE — Telephone Encounter (Signed)
Pt calling requesting a refill on spironolactone. Please address

## 2019-04-30 NOTE — Telephone Encounter (Signed)
Aldactone 25 mg refilled.

## 2019-06-04 ENCOUNTER — Other Ambulatory Visit: Payer: Self-pay

## 2019-06-04 DIAGNOSIS — Z20822 Contact with and (suspected) exposure to covid-19: Secondary | ICD-10-CM

## 2019-06-07 LAB — NOVEL CORONAVIRUS, NAA: SARS-CoV-2, NAA: NOT DETECTED

## 2019-09-23 ENCOUNTER — Other Ambulatory Visit: Payer: Self-pay

## 2019-09-23 DIAGNOSIS — Z20822 Contact with and (suspected) exposure to covid-19: Secondary | ICD-10-CM

## 2019-09-23 DIAGNOSIS — Z20828 Contact with and (suspected) exposure to other viral communicable diseases: Secondary | ICD-10-CM | POA: Diagnosis not present

## 2019-09-25 LAB — NOVEL CORONAVIRUS, NAA: SARS-CoV-2, NAA: NOT DETECTED

## 2019-12-29 DIAGNOSIS — Z7189 Other specified counseling: Secondary | ICD-10-CM | POA: Insufficient documentation

## 2019-12-29 NOTE — Progress Notes (Signed)
Cardiology Office Note   Date:  12/30/2019   ID:  Danielle Williams, DOB May 07, 1961, MRN [id number]  PCP:  Biagio Borg, MD  Cardiologist:   Minus Breeding, MD   Chief Complaint  Patient presents with  . Hypertension      History of Present Illness: Danielle Williams is a 59 y.o. female who presents for who presents for ongoing assessment and management of hypertension and history of tobacco abuse.   Since I last saw her she continues to have stress of working for the [employer].  She denies any new cardiovascular symptoms however.  She is not having any chest pressure, neck or arm discomfort.  She is not having any new shortness of breath, PND or orthopnea.  She has had no weight gain or edema.     Past Medical History:  Diagnosis Date  . Anemia    Iron def  . History of hepatitis    Type A  . Hypertension   . Recurrent boils    Recurrent  . Vitamin D deficiency     Past Surgical History:  Procedure Laterality Date  . ABDOMINAL HYSTERECTOMY     Due to fibroids     Current Outpatient Medications  Medication Sig Dispense Refill  . Biotin 10 MG TABS Take 1,000 mg by mouth daily.    . cholecalciferol (VITAMIN D3) 25 MCG (1000 UNIT) tablet Take 1,000 Units by mouth daily.    Marland Kitchen losartan (COZAAR) 50 MG tablet Take 1 tablet (50 mg total) by mouth daily. 90 tablet 3  . spironolactone (ALDACTONE) 25 MG tablet Take 1 tablet (25 mg total) by mouth daily. 90 tablet 3   No current facility-administered medications for this visit.    Allergies:   Patient has no known allergies.    ROS:  Please see the history of present illness.   Otherwise, review of systems are positive for none.   All other systems are reviewed and negative.    PHYSICAL EXAM: VS:  BP (!) 148/93   Pulse 64   Temp (!) 96.9 F (36.1 C)   Ht 5\' 7"  (1.702 m)   Wt 134 lb (60.8 kg)   SpO2 100%   BMI 20.99 kg/m  , BMI Body mass index is 20.99 kg/m. GENERAL:  Well appearing NECK:  No jugular venous  distention, waveform within normal limits, carotid upstroke brisk and symmetric, no bruits, no thyromegaly LUNGS:  Clear to auscultation bilaterally CHEST:  Unremarkable HEART:  PMI not displaced or sustained,S1 and S2 within normal limits, no S3, no S4, no clicks, no rubs, no murmurs ABD:  Flat, positive bowel sounds normal in frequency in pitch, no bruits, no rebound, no guarding, no midline pulsatile mass, no hepatomegaly, no splenomegaly EXT:  2 plus pulses throughout, no edema, no cyanosis no clubbing   EKG:  EKG is ordered today. The ekg ordered today demonstrates sinus rhythm, rate 64, axis within normal limits, intervals within normal limits, borderline criteria for LVH with repolarization changes.   Recent Labs: No results found for requested labs within last 8760 hours.    Lipid Panel    Component Value Date/Time   CHOL 209 (H) 05/26/2018 1436   TRIG 93.0 05/26/2018 1436   HDL 85.80 05/26/2018 1436   CHOLHDL 2 05/26/2018 1436   VLDL 18.6 05/26/2018 1436   LDLCALC 105 (H) 05/26/2018 1436      Wt Readings from Last 3 Encounters:  12/30/19 134 lb (60.8 kg)  10/12/18 134 lb  6.4 oz (61 kg)  05/26/18 129 lb (58.5 kg)      Other studies Reviewed: Additional studies/ records that were reviewed today include: None. Review of the above records demonstrates:  Please see elsewhere in the note.     ASSESSMENT AND PLAN:  HTN:    Her blood pressure is slightly elevated today.  She is going to keep her blood pressure readings every night for the next week and let me know what they are.  I might want to increase her Cozaar to 75 mg daily.  TOBACCO ABUSE:   This is a bad time to try to get her to stop smoking we talked about this over the years.  Hopefully distress would like not to change point.  COVID-19 EDUCATION:    She is group 1b and could get the vaccine now.  We talked to her about this.   Current medicines are reviewed at length with the patient today.  The patient  does not have concerns regarding medicines.  The following changes have been made:  no change  Labs/ tests ordered today include:   Orders Placed This Encounter  Procedures  . CBC  . Basic Metabolic Panel (BMET)  . Lipid Profile  . EKG 12-Lead     Disposition:   FU with me in one year.     Signed, Minus Breeding, MD  12/30/2019 11:44 AM    [facility]

## 2019-12-30 ENCOUNTER — Ambulatory Visit: Payer: Federal, State, Local not specified - PPO | Admitting: Cardiology

## 2019-12-30 ENCOUNTER — Other Ambulatory Visit: Payer: Self-pay

## 2019-12-30 ENCOUNTER — Encounter: Payer: Self-pay | Admitting: Cardiology

## 2019-12-30 VITALS — BP 148/93 | HR 64 | Temp 96.9°F | Ht 67.0 in | Wt 134.0 lb

## 2019-12-30 DIAGNOSIS — Z720 Tobacco use: Secondary | ICD-10-CM | POA: Diagnosis not present

## 2019-12-30 DIAGNOSIS — I10 Essential (primary) hypertension: Secondary | ICD-10-CM | POA: Diagnosis not present

## 2019-12-30 DIAGNOSIS — Z7189 Other specified counseling: Secondary | ICD-10-CM | POA: Diagnosis not present

## 2019-12-30 DIAGNOSIS — Z79899 Other long term (current) drug therapy: Secondary | ICD-10-CM

## 2019-12-30 DIAGNOSIS — E7800 Pure hypercholesterolemia, unspecified: Secondary | ICD-10-CM | POA: Diagnosis not present

## 2019-12-30 NOTE — Patient Instructions (Addendum)
Medication Instructions:  NO CHANGES *If you need a refill on your cardiac medications before your next appointment, please call your pharmacy*  Lab Work: Your physician recommends that you return for lab work today (CBC, BMP, LIPIDS) If you have labs (blood work) drawn today and your tests are completely normal, you will receive your results only by: Marland Kitchen MyChart Message (if you have MyChart) OR . A paper copy in the mail If you have any lab test that is abnormal or we need to change your treatment, we will call you to review the results.  Testing/Procedures: NONE  Follow-Up: At [facility], you and your health needs are our priority.  As part of our continuing mission to provide you with exceptional heart care, we have created designated Provider Care Teams.  These Care Teams include your primary Cardiologist (physician) and Advanced Practice Providers (APPs -  Physician Assistants and Nurse Practitioners) who all work together to provide you with the care you need, when you need it.  Your next appointment:   1 year(s)  The format for your next appointment:   In Person  Provider:   Minus Breeding, MD  OTHER INFORMATION:  CALL [phone] TO SCHEDULE YOUR COVID VACCINE. CALL DAILY TO SEE IF ANY NEW SPOTS HAVE OPENED. THEY GIVE THE VACCINE AT:  [facility], [address] [place], [place] [address]   [facility], [address], [id number], [place], [place] [address]   [facility] [address] [place], [place] [address]

## 2019-12-31 LAB — CBC
Hematocrit: 41.5 % (ref 34.0–46.6)
Hemoglobin: 13.9 g/dL (ref 11.1–15.9)
MCH: 30.3 pg (ref 26.6–33.0)
MCHC: 33.5 g/dL (ref 31.5–35.7)
MCV: 90 fL (ref 79–97)
Platelets: 244 10*3/uL (ref 150–450)
RBC: 4.59 x10E6/uL (ref 3.77–5.28)
RDW: 12.3 % (ref 11.7–15.4)
WBC: 6.1 10*3/uL (ref 3.4–10.8)

## 2019-12-31 LAB — BASIC METABOLIC PANEL
BUN/Creatinine Ratio: 14 (ref 9–23)
BUN: 11 mg/dL (ref 6–24)
CO2: 27 mmol/L (ref 20–29)
Calcium: 10.2 mg/dL (ref 8.7–10.2)
Chloride: 100 mmol/L (ref 96–106)
Creatinine, Ser: 0.77 mg/dL (ref 0.57–1.00)
GFR calc Af Amer: 98 mL/min/{1.73_m2} (ref >59)
GFR calc non Af Amer: 85 mL/min/{1.73_m2} (ref >59)
Glucose: 92 mg/dL (ref 65–99)
Potassium: 4.6 mmol/L (ref 3.5–5.2)
Sodium: 138 mmol/L (ref 134–144)

## 2019-12-31 LAB — LIPID PANEL
Chol/HDL Ratio: 2 ratio (ref 0.0–4.4)
Cholesterol, Total: 200 mg/dL — ABNORMAL HIGH (ref 100–199)
HDL: 98 mg/dL (ref >39)
LDL Chol Calc (NIH): 87 mg/dL (ref 0–99)
Triglycerides: 83 mg/dL (ref 0–149)
VLDL Cholesterol Cal: 15 mg/dL (ref 5–40)

## 2020-01-02 ENCOUNTER — Other Ambulatory Visit: Payer: Self-pay | Admitting: Cardiology

## 2020-01-03 ENCOUNTER — Telehealth: Payer: Self-pay

## 2020-01-03 NOTE — Telephone Encounter (Signed)
Spoke with patient, reviewed lab results. Patient aware LDL is okay and no change in therapy at this time. Verbalized understanding.

## 2020-01-03 NOTE — Telephone Encounter (Signed)
-----   Message from Minus Breeding, MD sent at 01/03/2020  7:14 AM EST ----- LDL is OK.  No change in therapy.  Call Ms. Duhon with the results and send results to Biagio Borg, MD

## 2020-01-12 DIAGNOSIS — Z6821 Body mass index (BMI) 21.0-21.9, adult: Secondary | ICD-10-CM | POA: Diagnosis not present

## 2020-01-12 DIAGNOSIS — Z01419 Encounter for gynecological examination (general) (routine) without abnormal findings: Secondary | ICD-10-CM | POA: Diagnosis not present

## 2020-02-09 DIAGNOSIS — Z1231 Encounter for screening mammogram for malignant neoplasm of breast: Secondary | ICD-10-CM | POA: Diagnosis not present

## 2020-04-24 ENCOUNTER — Other Ambulatory Visit: Payer: Self-pay | Admitting: Cardiology

## 2020-10-26 DIAGNOSIS — L0501 Pilonidal cyst with abscess: Secondary | ICD-10-CM | POA: Diagnosis not present

## 2020-12-23 ENCOUNTER — Other Ambulatory Visit: Payer: Self-pay | Admitting: Cardiology

## 2021-01-18 ENCOUNTER — Other Ambulatory Visit: Payer: Self-pay | Admitting: Cardiology

## 2021-01-27 ENCOUNTER — Other Ambulatory Visit: Payer: Self-pay | Admitting: Cardiology

## 2021-02-11 ENCOUNTER — Other Ambulatory Visit: Payer: Self-pay | Admitting: Cardiology

## 2021-02-20 DIAGNOSIS — N952 Postmenopausal atrophic vaginitis: Secondary | ICD-10-CM | POA: Diagnosis not present

## 2021-02-20 DIAGNOSIS — Z01419 Encounter for gynecological examination (general) (routine) without abnormal findings: Secondary | ICD-10-CM | POA: Diagnosis not present

## 2021-02-20 DIAGNOSIS — Z1231 Encounter for screening mammogram for malignant neoplasm of breast: Secondary | ICD-10-CM | POA: Diagnosis not present

## 2021-03-07 NOTE — Progress Notes (Signed)
Cardiology Office Note   Date:  03/08/2021   ID:  Danielle Williams, DOB 11-02-1961, MRN [id number]  PCP:  Biagio Borg, MD  Cardiologist:   Minus Breeding, MD   Chief Complaint  Patient presents with  . Hypertension      History of Present Illness: Danielle Williams is a 60 y.o. female who presents for who presents for ongoing assessment and management of hypertension and history of tobacco abuse.   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  She walked 29,000 feet recently on her job.  She does have significant stress obviously with his job.  However, she does not have any new cardiovascular complaints. The patient denies any new symptoms such as chest discomfort, neck or arm discomfort. There has been no new shortness of breath, PND or orthopnea. There have been no reported palpitations, presyncope or syncope.   Of note she did notice with pollen season that she developed what she thinks is a swollen lymph node under her right mandible.  Is not been painful.  She is otherwise felt okay.   Past Medical History:  Diagnosis Date  . Anemia    Iron def  . History of hepatitis    Type A  . Hypertension   . Recurrent boils    Recurrent  . Vitamin D deficiency     Past Surgical History:  Procedure Laterality Date  . ABDOMINAL HYSTERECTOMY     Due to fibroids     Current Outpatient Medications  Medication Sig Dispense Refill  . Biotin 10 MG TABS Take 1,000 mg by mouth daily.    . cholecalciferol (VITAMIN D3) 25 MCG (1000 UNIT) tablet Take 1,000 Units by mouth daily.    Marland Kitchen losartan (COZAAR) 50 MG tablet TAKE 1 TABLET BY MOUTH EVERY DAY 15 tablet 0  . spironolactone (ALDACTONE) 25 MG tablet Take 1 tablet (25 mg total) by mouth daily. NEED OV. 30 tablet 2   No current facility-administered medications for this visit.    Allergies:   Patient has no known allergies.    ROS:  Please see the history of present illness.   Otherwise,  review of systems are positive for none.   All other systems are reviewed and negative.    PHYSICAL EXAM: VS:  BP 128/80 (BP Location: Left Arm, Patient Position: Sitting, Cuff Size: Normal)   Pulse 86   Ht 5\' 7"  (1.702 m)   Wt 134 lb (60.8 kg)   BMI 20.99 kg/m  , BMI Body mass index is 20.99 kg/m. GENERAL:  Well appearing NECK:  No jugular venous distention, waveform within normal limits, carotid upstroke brisk and symmetric, no bruits, no thyromegaly, firm 2 x 3 cm probable lymph node under the right mandible LUNGS:  Clear to auscultation bilaterally CHEST:  Unremarkable HEART:  PMI not displaced or sustained,S1 and S2 within normal limits, no S3, no S4, no clicks, no rubs, no murmurs ABD:  Flat, positive bowel sounds normal in frequency in pitch, no bruits, no rebound, no guarding, no midline pulsatile mass, no hepatomegaly, no splenomegaly EXT:  2 plus pulses throughout, no edema, no cyanosis no clubbing   EKG:  EKG is  ordered today. The ekg ordered today demonstrates sinus rhythm, rate 86, axis within normal limits, intervals within normal limits, borderline criteria for LVH with repolarization changes.   Recent Labs: No results found for requested labs within last 8760 hours.  Lipid Panel    Component Value Date/Time   CHOL 200 (H) 12/30/2019 1131   TRIG 83 12/30/2019 1131   HDL 98 12/30/2019 1131   CHOLHDL 2.0 12/30/2019 1131   CHOLHDL 2 05/26/2018 1436   VLDL 18.6 05/26/2018 1436   LDLCALC 87 12/30/2019 1131      Wt Readings from Last 3 Encounters:  03/08/21 134 lb (60.8 kg)  12/30/19 134 lb (60.8 kg)  10/12/18 134 lb 6.4 oz (61 kg)      Other studies Reviewed: Additional studies/ records that were reviewed today include: Labs. Review of the above records demonstrates:  Please see elsewhere in the note.     ASSESSMENT AND PLAN:  HTN:    Her blood pressure is at target.  No change in therapy.   TOBACCO ABUSE:   She is slowly working on this and she  understands need to quit completely.   NECK MASS: She is going to let me know in my chart if this is still there in a couple of weeks and she might need an ultrasound of this.  Current medicines are reviewed at length with the patient today.  The patient does not have concerns regarding medicines.  The following changes have been made:  None  Labs/ tests ordered today include:    Orders Placed This Encounter  Procedures  . Lipid panel  . Basic metabolic panel  . CBC  . EKG 12-Lead     Disposition:   FU with me in one year.     Signed, Minus Breeding, MD  03/08/2021 10:24 AM    [facility]

## 2021-03-08 ENCOUNTER — Encounter: Payer: Self-pay | Admitting: Cardiology

## 2021-03-08 ENCOUNTER — Ambulatory Visit: Payer: Federal, State, Local not specified - PPO | Admitting: Cardiology

## 2021-03-08 ENCOUNTER — Other Ambulatory Visit: Payer: Self-pay

## 2021-03-08 VITALS — BP 128/80 | HR 86 | Ht 67.0 in | Wt 134.0 lb

## 2021-03-08 DIAGNOSIS — I10 Essential (primary) hypertension: Secondary | ICD-10-CM

## 2021-03-08 DIAGNOSIS — E7800 Pure hypercholesterolemia, unspecified: Secondary | ICD-10-CM

## 2021-03-08 DIAGNOSIS — Z720 Tobacco use: Secondary | ICD-10-CM | POA: Diagnosis not present

## 2021-03-08 LAB — BASIC METABOLIC PANEL
BUN/Creatinine Ratio: 11 (ref 9–23)
BUN: 10 mg/dL (ref 6–24)
CO2: 23 mmol/L (ref 20–29)
Calcium: 9.7 mg/dL (ref 8.7–10.2)
Chloride: 99 mmol/L (ref 96–106)
Creatinine, Ser: 0.88 mg/dL (ref 0.57–1.00)
Glucose: 97 mg/dL (ref 65–99)
Potassium: 4.7 mmol/L (ref 3.5–5.2)
Sodium: 138 mmol/L (ref 134–144)
eGFR: 76 mL/min/{1.73_m2} (ref >59)

## 2021-03-08 LAB — CBC
Hematocrit: 40.8 % (ref 34.0–46.6)
Hemoglobin: 13.9 g/dL (ref 11.1–15.9)
MCH: 30.1 pg (ref 26.6–33.0)
MCHC: 34.1 g/dL (ref 31.5–35.7)
MCV: 88 fL (ref 79–97)
Platelets: 237 10*3/uL (ref 150–450)
RBC: 4.62 x10E6/uL (ref 3.77–5.28)
RDW: 12.5 % (ref 11.7–15.4)
WBC: 5.5 10*3/uL (ref 3.4–10.8)

## 2021-03-08 LAB — LIPID PANEL
Chol/HDL Ratio: 2.2 ratio (ref 0.0–4.4)
Cholesterol, Total: 221 mg/dL — ABNORMAL HIGH (ref 100–199)
HDL: 99 mg/dL (ref >39)
LDL Chol Calc (NIH): 109 mg/dL — ABNORMAL HIGH (ref 0–99)
Triglycerides: 77 mg/dL (ref 0–149)
VLDL Cholesterol Cal: 13 mg/dL (ref 5–40)

## 2021-03-08 NOTE — Patient Instructions (Addendum)
Medication Instructions:  Your physician recommends that you continue on your current medications as directed. Please refer to the Current Medication list given to you today.  *If you need a refill on your cardiac medications before your next appointment, please call your pharmacy*  Lab Work: Your physician recommends that you return for lab work TODAY:   BMET  CBC  FLP If you have labs (blood work) drawn today and your tests are completely normal, you will receive your results only by: Marland Kitchen MyChart Message (if you have MyChart) OR . A paper copy in the mail If you have any lab test that is abnormal or we need to change your treatment, we will call you to review the results.  Testing/Procedures: NONE ordered at this time of appointment   Follow-Up: At [facility], you and your health needs are our priority.  As part of our continuing mission to provide you with exceptional heart care, we have created designated Provider Care Teams.  These Care Teams include your primary Cardiologist (physician) and Advanced Practice Providers (APPs -  Physician Assistants and Nurse Practitioners) who all work together to provide you with the care you need, when you need it.   Your next appointment:   1 year(s)  The format for your next appointment:   In Person  Provider:   You may see Minus Breeding, MD or one of the following Advanced Practice Providers on your designated Care Team:    Rosaria Ferries, PA-C  Jory Sims, DNP, ANP  Other Instructions

## 2021-03-13 ENCOUNTER — Telehealth: Payer: Self-pay | Admitting: *Deleted

## 2021-03-13 MED ORDER — SPIRONOLACTONE 25 MG PO TABS: 25.0000 mg | ORAL_TABLET | Freq: Every day | ORAL | 3 refills | Status: DC

## 2021-03-13 MED ORDER — LOSARTAN POTASSIUM 50 MG PO TABS: 50.0000 mg | ORAL_TABLET | Freq: Every day | ORAL | 3 refills | Status: DC

## 2021-03-13 NOTE — Telephone Encounter (Signed)
Advised patient of lab results and order placed for Calcium score  Patient stated after she had her visit last week she went home and her stomach was upset She had diarrhea once and was loose next am She had felt lightheaded,chills, and just "felt off"  She did not day after her appointment secondary to how she was feeling, rested the entire day C/o some slight tightness in chest/shortness of breath, no vital signs taken  She is feeling better Advised patient if chest pain/shortness of breath returns to call back, GI s/s return and do not resolve contact PCP  Will forward to Dr Percival Spanish so he will be aware

## 2021-03-13 NOTE — Telephone Encounter (Signed)
-----   Message from Minus Breeding, MD sent at 03/11/2021 10:15 AM EDT ----- Cholesterol is slightly elevated.  HDL is good.  No change in therapy at this point.  She should continue to watch her diet.  I would like for her to have a coronary calcium score to let me know the goals of therapy. Call Ms. Sider with the results and send results to Biagio Borg, MD

## 2021-04-24 ENCOUNTER — Ambulatory Visit (INDEPENDENT_AMBULATORY_CARE_PROVIDER_SITE_OTHER)
Admission: RE | Admit: 2021-04-24 | Discharge: 2021-04-24 | Disposition: A | Discharge status: Home / Self Care | Payer: Self-pay | Source: Ambulatory Visit | Attending: Cardiology | Admitting: Cardiology

## 2021-04-24 ENCOUNTER — Other Ambulatory Visit: Payer: Self-pay

## 2021-04-24 DIAGNOSIS — I10 Essential (primary) hypertension: Secondary | ICD-10-CM

## 2021-06-20 ENCOUNTER — Ambulatory Visit: Payer: Federal, State, Local not specified - PPO | Admitting: Podiatry

## 2021-06-20 ENCOUNTER — Other Ambulatory Visit: Payer: Self-pay

## 2021-06-20 DIAGNOSIS — Z79899 Other long term (current) drug therapy: Secondary | ICD-10-CM | POA: Diagnosis not present

## 2021-06-20 DIAGNOSIS — L600 Ingrowing nail: Secondary | ICD-10-CM

## 2021-06-20 DIAGNOSIS — B351 Tinea unguium: Secondary | ICD-10-CM | POA: Diagnosis not present

## 2021-06-21 DIAGNOSIS — Z79899 Other long term (current) drug therapy: Secondary | ICD-10-CM | POA: Diagnosis not present

## 2021-06-22 ENCOUNTER — Encounter: Payer: Self-pay | Admitting: Podiatry

## 2021-06-22 LAB — HEPATIC FUNCTION PANEL
AG Ratio: 1.8 (calc) (ref 1.0–2.5)
ALT: 11 U/L (ref 6–29)
AST: 17 U/L (ref 10–35)
Albumin: 4.6 g/dL (ref 3.6–5.1)
Alkaline phosphatase (APISO): 73 U/L (ref 37–153)
Bilirubin, Direct: 0.1 mg/dL (ref 0.0–0.2)
Globulin: 2.5 g/dL (calc) (ref 1.9–3.7)
Indirect Bilirubin: 0.4 mg/dL (calc) (ref 0.2–1.2)
Total Bilirubin: 0.5 mg/dL (ref 0.2–1.2)
Total Protein: 7.1 g/dL (ref 6.1–8.1)

## 2021-06-22 MED ORDER — TERBINAFINE HCL 250 MG PO TABS: 250.0000 mg | ORAL_TABLET | Freq: Every day | ORAL | 0 refills | Status: DC

## 2021-06-22 NOTE — Progress Notes (Signed)
Subjective:  Patient ID: Danielle Williams, female    DOB: 03-22-1961,  MRN: [id number]  Chief Complaint  Patient presents with   Nail Problem    Right hallux nail is coming off     60 y.o. female presents with the above complaint.  Patient presents with thickened elongated dystrophic bilateral hallux.  Patient states is painful to touch.  She would like to have both of the nails removed make permanent that it never grows back.  For the remaining 8 toenails she would like to discuss treatment options for nail fungus.  She has not tried any for it.  She has never been on Lamisil therapy she denies any other acute complaints.  She would like to discuss treatment options.  Pain scale is 7 out of 10.  Painful to touch of all the nails.   Review of Systems: Negative except as noted in the HPI. Denies N/V/F/Ch.  Past Medical History:  Diagnosis Date   Anemia    Iron def   History of hepatitis    Type A   Hypertension    Recurrent boils    Recurrent   Vitamin D deficiency     Current Outpatient Medications:    terbinafine (LAMISIL) 250 MG tablet, Take 1 tablet (250 mg total) by mouth daily., Disp: 90 tablet, Rfl: 0   Biotin 10 MG TABS, Take 1,000 mg by mouth daily., Disp: , Rfl:    cholecalciferol (VITAMIN D3) 25 MCG (1000 UNIT) tablet, Take 1,000 Units by mouth daily., Disp: , Rfl:    losartan (COZAAR) 50 MG tablet, Take 1 tablet (50 mg total) by mouth daily., Disp: 90 tablet, Rfl: 3   spironolactone (ALDACTONE) 25 MG tablet, Take 1 tablet (25 mg total) by mouth daily., Disp: 90 tablet, Rfl: 3  Social History   Tobacco Use  Smoking Status Some Days   Packs/day: 0.50   Years: 30.00   Pack years: 15.00   Types: Cigarettes  Smokeless Tobacco Never  Tobacco Comments   She is trying to taper.      No Known Allergies Objective:  There were no vitals filed for this visit. There is no height or weight on file to calculate BMI. Constitutional Well developed. Well nourished.   Vascular Dorsalis pedis pulses palpable bilaterally. Posterior tibial pulses palpable bilaterally. Capillary refill normal to all digits.  No cyanosis or clubbing noted. Pedal hair growth normal.  Neurologic Normal speech. Oriented to person, place, and time. Epicritic sensation to light touch grossly present bilaterally.  Dermatologic Pain on palpation of the entire/total nail on 1st digit of the bilaterally with underlying ingrown.  Thickened elongated dystrophic toenails x8 to the rest of the nails.  Mild pain on palpation.  Mycotic nature to it. No other open wounds. No skin lesions.  Orthopedic: Normal joint ROM without pain or crepitus bilaterally. No visible deformities. No bony tenderness.   Radiographs: None Assessment:   1. Long-term use of high-risk medication   2. Ingrown toenail of right foot   3. Ingrown nail of great toe of left foot   4. Onychomycosis due to dermatophyte    Plan:  Patient was evaluated and treated and all questions answered.  Onychomycosis x8 -Educated the patient on the etiology of onychomycosis and various treatment options associated with improving the fungal load.  I explained to the patient that there is 3 treatment options available to treat the onychomycosis including topical, p.o., laser treatment.  Patient elected to undergo p.o. options with Lamisil/terbinafine therapy.  In order for me to start the medication therapy, I explained to the patient the importance of evaluating the liver and obtaining the liver function test.  Once the liver function test comes back normal I will start him on 30-month course of Lamisil therapy.  Patient understood all risk and would like to proceed with Lamisil therapy.  I have asked the patient to immediately stop the Lamisil therapy if she has any reactions to it and call the office or go to the emergency room right away.  Patient states understanding   Nail contusion/dystrophy hallux with underlying ingrown,  bilaterally -Patient elects to proceed with minor surgery to remove entire toenail today. Consent reviewed and signed by patient. -Entire/total nail excised. See procedure note. -Educated on post-procedure care including soaking. Written instructions provided and reviewed. -Patient to follow up in 2 weeks for nail check.  Procedure: Excision of entire/total nail with phenol matricectomy Location: Bilateral 1st toe digit Anesthesia: Lidocaine 1% plain; 1.5 mL and Marcaine 0.5% plain; 1.5 mL, digital block. Skin Prep: Betadine. Dressing: Silvadene; telfa; dry, sterile, compression dressing. Technique: Following skin prep, the toe was exsanguinated and a tourniquet was secured at the base of the toe. The affected nail border was freed and excised.  Phenol matricectomy was performed in standard technique without complication.  The tourniquet was then removed and sterile dressing applied. Disposition: Patient tolerated procedure well. Patient to return in 2 weeks for follow-up.   No follow-ups on file.

## 2021-07-04 ENCOUNTER — Other Ambulatory Visit: Payer: Self-pay

## 2021-07-04 ENCOUNTER — Ambulatory Visit: Payer: Federal, State, Local not specified - PPO | Admitting: Podiatry

## 2021-07-04 ENCOUNTER — Encounter: Payer: Self-pay | Admitting: Podiatry

## 2021-07-04 DIAGNOSIS — L03031 Cellulitis of right toe: Secondary | ICD-10-CM | POA: Diagnosis not present

## 2021-07-04 DIAGNOSIS — L600 Ingrowing nail: Secondary | ICD-10-CM

## 2021-07-04 MED ORDER — DOXYCYCLINE HYCLATE 100 MG PO TABS: 100.0000 mg | ORAL_TABLET | Freq: Two times a day (BID) | ORAL | 0 refills | Status: AC

## 2021-07-10 ENCOUNTER — Encounter: Payer: Self-pay | Admitting: Podiatry

## 2021-07-10 NOTE — Progress Notes (Signed)
Subjective:  Patient ID: Danielle Williams, female    DOB: [birth year],  MRN: [id number]  Chief Complaint  Patient presents with   Ingrown Toenail    Right hallux ingrown nail follow up PT stated that if she wears socks and shoes her toe will be wet from the drainage     60 y.o. female presents with the above complaint.  Patient presents for follow-up to right hallux ingrown paronychia status post ingrown removal.  Patient states she wears socks her toe stays well with drainage.  She wanted to get it evaluated.  She had entire nail removed by me of the right great toe.  She denies any acute complaints.  She has some mild pain and mild drainage she wanted get eval make sure that that was doing okay.   Review of Systems: Negative except as noted in the HPI. Denies N/V/F/Ch.  Past Medical History:  Diagnosis Date   Anemia    Iron def   History of hepatitis    Type A   Hypertension    Recurrent boils    Recurrent   Vitamin D deficiency     Current Outpatient Medications:    doxycycline (VIBRA-TABS) 100 MG tablet, Take 1 tablet (100 mg total) by mouth 2 (two) times daily for 14 days., Disp: 28 tablet, Rfl: 0   Biotin 10 MG TABS, Take 1,000 mg by mouth daily., Disp: , Rfl:    cholecalciferol (VITAMIN D3) 25 MCG (1000 UNIT) tablet, Take 1,000 Units by mouth daily., Disp: , Rfl:    losartan (COZAAR) 50 MG tablet, Take 1 tablet (50 mg total) by mouth daily., Disp: 90 tablet, Rfl: 3   spironolactone (ALDACTONE) 25 MG tablet, Take 1 tablet (25 mg total) by mouth daily., Disp: 90 tablet, Rfl: 3   terbinafine (LAMISIL) 250 MG tablet, Take 1 tablet (250 mg total) by mouth daily., Disp: 90 tablet, Rfl: 0  Social History   Tobacco Use  Smoking Status Some Days   Packs/day: 0.50   Years: 30.00   Pack years: 15.00   Types: Cigarettes  Smokeless Tobacco Never  Tobacco Comments   She is trying to taper.      No Known Allergies Objective:  There were no vitals filed for this visit. There  is no height or weight on file to calculate BMI. Constitutional Well developed. Well nourished.  Vascular Dorsalis pedis pulses palpable bilaterally. Posterior tibial pulses palpable bilaterally. Capillary refill normal to all digits.  No cyanosis or clubbing noted. Pedal hair growth normal.  Neurologic Normal speech. Oriented to person, place, and time. Epicritic sensation to light touch grossly present bilaterally.  Dermatologic Nails well groomed and normal in appearance. No open wounds. No skin lesions.  Orthopedic: Right hallux macerated wound base noted without any open wounds.  Mild paronychia noted.  Mild pain associated with it.  No other clinical signs of infection noted.  No malodor present.   Radiographs: None Assessment:   1. Paronychia of toe of right foot due to ingrown toenail    Plan:  Patient was evaluated and treated and all questions answered.  Right hallux paronychia with underlying maceration -I explained the patient the etiology of paronychia versus treatment options were discussed.  I believe patient would benefit from Betadine wet-to-dry dressing changes daily for next few weeks.  Also discussed with her to stop getting this foot wet and allow the dryness to take over.  Patient states understanding -I placed her on doxycycline for skin and soft tissue prophylaxis.  No follow-ups on file.

## 2021-07-18 ENCOUNTER — Ambulatory Visit: Payer: Federal, State, Local not specified - PPO | Admitting: Podiatry

## 2021-09-26 ENCOUNTER — Telehealth: Payer: Self-pay | Admitting: Internal Medicine

## 2021-09-26 NOTE — Telephone Encounter (Signed)
   Patient last seen 3 years ago. She states she hasnt made any appointments due to pandemic. Are you okay with re- establishing with her.  Please advise

## 2021-09-26 NOTE — Telephone Encounter (Signed)
Ok with me 

## 2021-10-31 ENCOUNTER — Encounter: Payer: Self-pay | Admitting: Internal Medicine

## 2021-10-31 ENCOUNTER — Ambulatory Visit (INDEPENDENT_AMBULATORY_CARE_PROVIDER_SITE_OTHER): Payer: Federal, State, Local not specified - PPO | Admitting: Internal Medicine

## 2021-10-31 ENCOUNTER — Other Ambulatory Visit (INDEPENDENT_AMBULATORY_CARE_PROVIDER_SITE_OTHER): Payer: Federal, State, Local not specified - PPO

## 2021-10-31 ENCOUNTER — Other Ambulatory Visit: Payer: Self-pay

## 2021-10-31 VITALS — BP 118/70 | HR 80 | Resp 18 | Ht 67.0 in | Wt 134.6 lb

## 2021-10-31 DIAGNOSIS — E785 Hyperlipidemia, unspecified: Secondary | ICD-10-CM

## 2021-10-31 DIAGNOSIS — I6529 Occlusion and stenosis of unspecified carotid artery: Secondary | ICD-10-CM

## 2021-10-31 DIAGNOSIS — I10 Essential (primary) hypertension: Secondary | ICD-10-CM | POA: Diagnosis not present

## 2021-10-31 DIAGNOSIS — E538 Deficiency of other specified B group vitamins: Secondary | ICD-10-CM | POA: Diagnosis not present

## 2021-10-31 DIAGNOSIS — Z1211 Encounter for screening for malignant neoplasm of colon: Secondary | ICD-10-CM

## 2021-10-31 DIAGNOSIS — Z0000 Encounter for general adult medical examination without abnormal findings: Secondary | ICD-10-CM | POA: Diagnosis not present

## 2021-10-31 DIAGNOSIS — E7800 Pure hypercholesterolemia, unspecified: Secondary | ICD-10-CM | POA: Diagnosis not present

## 2021-10-31 DIAGNOSIS — F419 Anxiety disorder, unspecified: Secondary | ICD-10-CM | POA: Diagnosis not present

## 2021-10-31 DIAGNOSIS — E559 Vitamin D deficiency, unspecified: Secondary | ICD-10-CM | POA: Diagnosis not present

## 2021-10-31 DIAGNOSIS — Z720 Tobacco use: Secondary | ICD-10-CM

## 2021-10-31 DIAGNOSIS — F32A Depression, unspecified: Secondary | ICD-10-CM

## 2021-10-31 DIAGNOSIS — Z0001 Encounter for general adult medical examination with abnormal findings: Secondary | ICD-10-CM | POA: Diagnosis not present

## 2021-10-31 LAB — BASIC METABOLIC PANEL
BUN: 14 mg/dL (ref 6–23)
CO2: 29 mEq/L (ref 19–32)
Calcium: 9.7 mg/dL (ref 8.4–10.5)
Chloride: 100 mEq/L (ref 96–112)
Creatinine, Ser: 1.05 mg/dL (ref 0.40–1.20)
GFR: 57.83 mL/min — ABNORMAL LOW (ref >60.00)
Glucose, Bld: 96 mg/dL (ref 70–99)
Potassium: 3.8 mEq/L (ref 3.5–5.1)
Sodium: 137 mEq/L (ref 135–145)

## 2021-10-31 LAB — LIPID PANEL
Cholesterol: 199 mg/dL (ref 0–200)
HDL: 88.6 mg/dL (ref >39.00)
LDL Cholesterol: 94 mg/dL (ref 0–99)
NonHDL: 110.68
Total CHOL/HDL Ratio: 2
Triglycerides: 82 mg/dL (ref 0.0–149.0)
VLDL: 16.4 mg/dL (ref 0.0–40.0)

## 2021-10-31 LAB — HEPATIC FUNCTION PANEL
ALT: 12 U/L (ref 0–35)
AST: 17 U/L (ref 0–37)
Albumin: 4.6 g/dL (ref 3.5–5.2)
Alkaline Phosphatase: 70 U/L (ref 39–117)
Bilirubin, Direct: 0.1 mg/dL (ref 0.0–0.3)
Total Bilirubin: 0.4 mg/dL (ref 0.2–1.2)
Total Protein: 7.4 g/dL (ref 6.0–8.3)

## 2021-10-31 NOTE — Patient Instructions (Signed)
You are given the doctor note today  Please quit smoking  Please continue all other medications as before, and refills have been done if requested.  Please have the pharmacy call with any other refills you may need.  Please continue your efforts at being more active, low cholesterol diet, and weight control.  You are otherwise up to date with prevention measures today.  Please keep your appointments with your specialists as you may have planned  Please go to the LAB at the blood drawing area for the tests to be done  You will be contacted by phone if any changes need to be made immediately.  Otherwise, you will receive a letter about your results with an explanation, but please check with MyChart first.  Please remember to sign up for MyChart if you have not done so, as this will be important to you in the future with finding out test results, communicating by private email, and scheduling acute appointments online when needed.  Please make an Appointment to return for your 1 year visit, or sooner if needed

## 2021-10-31 NOTE — Progress Notes (Signed)
Patient ID: Danielle Williams, female   DOB: 11-08-1961, 60 y.o.   MRN: [id number]         Chief Complaint:: wellness exam and restablish new pt       HPI:  Danielle Williams is a 60 y.o. female here for wellness exam; due for colonoscopy, decliens shingrix, and pneumovax, o/w up to date                        Also has hx fo smoking, not ready to quit.  Has hx of carotid disease, and goal LDL < 70, pt asks for f/u lab today, not ready to consider statin yet.  Trying to follow lower chol diet.  Pt denies chest pain, increased sob or doe, wheezing, orthopnea, PND, increased LE swelling, palpitations, dizziness or syncope.   Pt denies polydipsia, polyuria, or new focal neuro s/s.   Pt denies fever, wt loss, night sweats, loss of appetite, or other constitutional symptoms  Denies worsening depressive symptoms, suicidal ideation, or panic; has ongoing anxiety.    Wt Readings from Last 3 Encounters:  10/31/21 134 lb 9.6 oz (61.1 kg)  03/08/21 134 lb (60.8 kg)  12/30/19 134 lb (60.8 kg)   BP Readings from Last 3 Encounters:  10/31/21 118/70  03/08/21 128/80  12/30/19 (!) 148/93   Immunization History  Administered Date(s) Administered   Influenza Inj Mdck Quad With Preservative 09/28/2018, 09/16/2019   Influenza, Seasonal, Injecte, Preservative Fre 10/16/2013   Influenza-Unspecified 10/16/2014, 09/16/2015, 10/31/2015, 09/17/2016, 09/15/2020, 10/10/2021   PFIZER(Purple Top)SARS-COV-2 Vaccination 02/24/2020, 03/21/2020, 10/21/2020, 05/04/2021   Pfizer Covid-19 Vaccine Bivalent Booster 5y-11y 10/10/2021   Td 10/17/2008   Tdap 11/16/2015   Health Maintenance Due  Topic Date Due   COLONOSCOPY (Pts 45-17yrs Insurance coverage will need to be confirmed)  02/09/2020      Past Medical History:  Diagnosis Date   Anemia    Iron def   History of hepatitis    Type A   Hypertension    Recurrent boils    Recurrent   Vitamin D deficiency    Past Surgical History:  Procedure Laterality Date    ABDOMINAL HYSTERECTOMY     Due to fibroids    reports that she has been smoking cigarettes. She has a 15.00 pack-year smoking history. She has never used smokeless tobacco. She reports current alcohol use. She reports that she does not use drugs. family history includes Alcohol abuse in her cousin; Arthritis in an other family member; Breast cancer in her cousin; Cancer in some other family members; Dementia in her mother; Diabetes in her sister; Heart disease in an other family member; Hypertension in her sister; Other in her father; Ovarian cancer in her sister; Throat cancer in her paternal aunt. No Known Allergies Current Outpatient Medications on File Prior to Visit  Medication Sig Dispense Refill   Biotin 10 MG TABS Take 1,000 mg by mouth daily.     cholecalciferol (VITAMIN D3) 25 MCG (1000 UNIT) tablet Take 1,000 Units by mouth daily.     losartan (COZAAR) 50 MG tablet Take 1 tablet (50 mg total) by mouth daily. 90 tablet 3   spironolactone (ALDACTONE) 25 MG tablet Take 1 tablet (25 mg total) by mouth daily. 90 tablet 3   No current facility-administered medications on file prior to visit.        ROS:  All others reviewed and negative.  Objective        PE:  BP 118/70  Pulse 80   Resp 18   Ht 5\' 7"  (1.702 m)   Wt 134 lb 9.6 oz (61.1 kg)   SpO2 98%   BMI 21.08 kg/m                 Constitutional: Pt appears in NAD               HENT: Head: NCAT.                Right Ear: External ear normal.                 Left Ear: External ear normal.                Eyes: . Pupils are equal, round, and reactive to light. Conjunctivae and EOM are normal               Nose: without d/c or deformity               Neck: Neck supple. Gross normal ROM               Cardiovascular: Normal rate and regular rhythm.                 Pulmonary/Chest: Effort normal and breath sounds without rales or wheezing.                Abd:  Soft, NT, ND, + BS, no organomegaly               Neurological: Pt is  alert. At baseline orientation, motor grossly intact               Skin: Skin is warm. No rashes, no other new lesions, LE edema - none               Psychiatric: Pt behavior is normal without agitation   Micro: none  Cardiac tracings I have personally interpreted today:  none  Pertinent Radiological findings (summarize): none   Lab Results  Component Value Date   WBC 7.3 10/31/2021   HGB 13.2 10/31/2021   HCT 39.4 10/31/2021   PLT 218.0 10/31/2021   GLUCOSE 96 10/31/2021   CHOL 199 10/31/2021   TRIG 82.0 10/31/2021   HDL 88.60 10/31/2021   LDLCALC 94 10/31/2021   ALT 12 10/31/2021   AST 17 10/31/2021   NA 137 10/31/2021   K 3.8 10/31/2021   CL 100 10/31/2021   CREATININE 1.05 10/31/2021   BUN 14 10/31/2021   CO2 29 10/31/2021   TSH 3.54 10/31/2021   Assessment/Plan:  Danielle Williams is a 60 y.o. Black or African American [2] female with  has a past medical history of Anemia, History of hepatitis, Hypertension, Recurrent boils, and Vitamin D deficiency.  Encounter for well adult exam with abnormal findings Age and sex appropriate education and counseling updated with regular exercise and diet Referrals for preventative services - for colonoscopy Immunizations addressed - declines shignirx and pneumovax Smoking counseling  - none needed Evidence for depression or other mood disorder - chronic anxiety Most recent labs reviewed. I have personally reviewed and have noted: 1) the patient's medical and social history 2) The patient's current medications and supplements 3) The patient's height, weight, and BMI have been recorded in the chart   Carotid artery stenosis Exacerbated by tobacco and hld, with goal ldl < 70, declines statin for now  Lab Results  Component Value Date   LDLCALC 94 10/31/2021  Tobacco abuse Pt counseled to quit, pt not ready  HTN (hypertension) BP Readings from Last 3 Encounters:  10/31/21 118/70  03/08/21 128/80  12/30/19 (!) 148/93    Stable, pt to continue medical treatment  - losartan   Anxiety and depression Chronic stable, declines need for med tx, counseling or psychiatric referral  HLD (hyperlipidemia) Lab Results  Component Value Date   LDLCALC 94 10/31/2021   Stable, pt to continue current low chol diet, declines statin for now  Followup: Return in about 1 year (around 10/31/2022).  Cathlean Cower, MD 11/04/2021 5:38 AM [facility]

## 2021-11-01 ENCOUNTER — Encounter: Payer: Self-pay | Admitting: Internal Medicine

## 2021-11-01 LAB — CBC WITH DIFFERENTIAL/PLATELET
Basophils Absolute: 0 10*3/uL (ref 0.0–0.1)
Basophils Relative: 0.5 % (ref 0.0–3.0)
Eosinophils Absolute: 0.1 10*3/uL (ref 0.0–0.7)
Eosinophils Relative: 1.1 % (ref 0.0–5.0)
HCT: 39.4 % (ref 36.0–46.0)
Hemoglobin: 13.2 g/dL (ref 12.0–15.0)
Lymphocytes Relative: 31.4 % (ref 12.0–46.0)
Lymphs Abs: 2.3 10*3/uL (ref 0.7–4.0)
MCHC: 33.4 g/dL (ref 30.0–36.0)
MCV: 89.5 fl (ref 78.0–100.0)
Monocytes Absolute: 0.5 10*3/uL (ref 0.1–1.0)
Monocytes Relative: 7.2 % (ref 3.0–12.0)
Neutro Abs: 4.3 10*3/uL (ref 1.4–7.7)
Neutrophils Relative %: 59.8 % (ref 43.0–77.0)
Platelets: 218 10*3/uL (ref 150.0–400.0)
RBC: 4.4 Mil/uL (ref 3.87–5.11)
RDW: 13.4 % (ref 11.5–15.5)
WBC: 7.3 10*3/uL (ref 4.0–10.5)

## 2021-11-01 LAB — URINALYSIS, ROUTINE W REFLEX MICROSCOPIC
Bilirubin Urine: NEGATIVE
Hgb urine dipstick: NEGATIVE
Ketones, ur: NEGATIVE
Leukocytes,Ua: NEGATIVE
Nitrite: NEGATIVE
Specific Gravity, Urine: 1.015 (ref 1.000–1.030)
Total Protein, Urine: NEGATIVE
Urine Glucose: NEGATIVE
Urobilinogen, UA: 0.2 (ref 0.0–1.0)
pH: 7 (ref 5.0–8.0)

## 2021-11-01 LAB — TSH: TSH: 3.54 u[IU]/mL (ref 0.35–5.50)

## 2021-11-01 LAB — VITAMIN D 25 HYDROXY (VIT D DEFICIENCY, FRACTURES): VITD: 39.75 ng/mL (ref 30.00–100.00)

## 2021-11-01 LAB — VITAMIN B12: Vitamin B-12: 405 pg/mL (ref 211–911)

## 2021-11-04 ENCOUNTER — Encounter: Payer: Self-pay | Admitting: Internal Medicine

## 2021-11-04 DIAGNOSIS — Z0001 Encounter for general adult medical examination with abnormal findings: Secondary | ICD-10-CM | POA: Insufficient documentation

## 2021-11-04 DIAGNOSIS — E785 Hyperlipidemia, unspecified: Secondary | ICD-10-CM | POA: Insufficient documentation

## 2021-11-04 NOTE — Assessment & Plan Note (Signed)
Age and sex appropriate education and counseling updated with regular exercise and diet Referrals for preventative services - for colonoscopy Immunizations addressed - declines shignirx and pneumovax Smoking counseling  - none needed Evidence for depression or other mood disorder - chronic anxiety Most recent labs reviewed. I have personally reviewed and have noted: 1) the patient's medical and social history 2) The patient's current medications and supplements 3) The patient's height, weight, and BMI have been recorded in the chart

## 2021-11-04 NOTE — Assessment & Plan Note (Signed)
BP Readings from Last 3 Encounters:  10/31/21 118/70  03/08/21 128/80  12/30/19 (!) 148/93   Stable, pt to continue medical treatment  - losartan

## 2021-11-04 NOTE — Assessment & Plan Note (Signed)
Chronic stable, declines need for med tx, counseling or psychiatric referral

## 2021-11-04 NOTE — Assessment & Plan Note (Signed)
Lab Results  Component Value Date   LDLCALC 94 10/31/2021   Stable, pt to continue current low chol diet, declines statin for now

## 2021-11-04 NOTE — Assessment & Plan Note (Signed)
Exacerbated by tobacco and hld, with goal ldl < 70, declines statin for now  Lab Results  Component Value Date   LDLCALC 94 10/31/2021

## 2021-11-04 NOTE — Assessment & Plan Note (Signed)
Pt counseled to quit, pt not ready 

## 2021-11-20 DIAGNOSIS — B351 Tinea unguium: Secondary | ICD-10-CM | POA: Diagnosis not present

## 2021-11-20 DIAGNOSIS — L609 Nail disorder, unspecified: Secondary | ICD-10-CM | POA: Diagnosis not present

## 2022-02-19 ENCOUNTER — Other Ambulatory Visit: Payer: Self-pay

## 2022-02-19 MED ORDER — LOSARTAN POTASSIUM 50 MG PO TABS: 50.0000 mg | ORAL_TABLET | Freq: Every day | ORAL | 3 refills | Status: DC

## 2022-03-10 NOTE — Progress Notes (Signed)
?  ?Cardiology Office Note ? ? ?Date:  03/11/2022  ? ?ID:  Danielle Williams, DOB September 01, 1961, MRN [id number] ? ?PCP:  Biagio Borg, MD  ?Cardiologist:   Minus Breeding, MD ? ? ?Chief Complaint  ?Patient presents with  ? Hypertension  ? ? ?  ?History of Present Illness: ?Danielle Williams is a 61 y.o. female who presents for who presents for ongoing assessment and management of hypertension and history of tobacco abuse.   She also has a history of mild mitral valve prolapse.  Last year she had a zero coronary calcium score.   ? ?Since I last saw her she has done well. The patient denies any new symptoms such as chest discomfort, neck or arm discomfort. There has been no new shortness of breath, PND or orthopnea. There have been no reported palpitations, presyncope or syncope.  She still works at the [employer] but it is a little less stressful.  She is also taking time for herself more. ? ? ?Past Medical History:  ?Diagnosis Date  ? Anemia   ? Iron def  ? History of hepatitis   ? Type A  ? Hypertension   ? Recurrent boils   ? Recurrent  ? Vitamin D deficiency   ? ? ?Past Surgical History:  ?Procedure Laterality Date  ? ABDOMINAL HYSTERECTOMY    ? Due to fibroids  ? ? ? ?Current Outpatient Medications  ?Medication Sig Dispense Refill  ? Biotin 10 MG TABS Take 1,000 mg by mouth daily.    ? cholecalciferol (VITAMIN D3) 25 MCG (1000 UNIT) tablet Take 1,000 Units by mouth daily.    ? losartan (COZAAR) 50 MG tablet Take 1 tablet (50 mg total) by mouth daily. 90 tablet 3  ? spironolactone (ALDACTONE) 25 MG tablet Take 1 tablet (25 mg total) by mouth daily. 90 tablet 3  ? ?No current facility-administered medications for this visit.  ? ? ?Allergies:   Patient has no known allergies.  ? ? ?ROS:  Please see the history of present illness.   Otherwise, review of systems are positive for none.   All other systems are reviewed and negative.  ? ? ?PHYSICAL EXAM: ?VS:  BP 138/88 (BP Location: Left Arm, Patient Position: Sitting, Cuff  Size: Normal)   Pulse 76   Ht '5\' 7"'$  (1.702 m)   Wt 132 lb (59.9 kg)   BMI 20.67 kg/m?  , BMI Body mass index is 20.67 kg/m?. ?GENERAL:  Well appearing ?NECK:  No jugular venous distention, waveform within normal limits, carotid upstroke brisk and symmetric, no bruits, no thyromegaly ?LUNGS:  Clear to auscultation bilaterally ?CHEST:  Unremarkable ?HEART:  PMI not displaced or sustained,S1 and S2 within normal limits, no S3, no S4, no clicks, no rubs, no murmurs ?ABD:  Flat, positive bowel sounds normal in frequency in pitch, no bruits, no rebound, no guarding, no midline pulsatile mass, no hepatomegaly, no splenomegaly ?EXT:  2 plus pulses throughout, no edema, no cyanosis no clubbing ? ? ?EKG:  EKG is  ordered today. ?The ekg ordered today demonstrates sinus rhythm, rate 76, axis within normal limits, intervals within normal limits, borderline criteria for LVH with repolarization changes. ? ? ?Recent Labs: ?10/31/2021: ALT 12; BUN 14; Creatinine, Ser 1.05; Hemoglobin 13.2; Platelets 218.0; Potassium 3.8; Sodium 137; TSH 3.54  ? ? ?Lipid Panel ?   ?Component Value Date/Time  ? CHOL 199 10/31/2021 1652  ? CHOL 221 (H) 03/08/2021 1058  ? TRIG 82.0 10/31/2021 1652  ? HDL 88.60  10/31/2021 1652  ? HDL 99 03/08/2021 1058  ? CHOLHDL 2 10/31/2021 1652  ? VLDL 16.4 10/31/2021 1652  ? [place] 94 10/31/2021 1652  ? LDLCALC 109 (H) 03/08/2021 1058  ? ?  ? ?Wt Readings from Last 3 Encounters:  ?03/11/22 132 lb (59.9 kg)  ?10/31/21 134 lb 9.6 oz (61.1 kg)  ?03/08/21 134 lb (60.8 kg)  ?  ? ? ?Other studies Reviewed: ?Additional studies/ records that were reviewed today include: Labs ?Marland Kitchen ?Review of the above records demonstrates:  Please see elsewhere in the note.   ? ? ?ASSESSMENT AND PLAN: ? ?HTN:    At target.  No change in therapy.  ? ?TOBACCO ABUSE:    She is down to a few cigarettes and I encourage complete abstinence.  ? ?MVP: This was mild years ago on an echo.  I do not hear any regurgitant murmur.  I will follow this  clinically. ? ?Current medicines are reviewed at length with the patient today.  The patient does not have concerns regarding medicines. ? ?The following changes have been made: None ? ?Labs/ tests ordered today include: None ? ?Orders Placed This Encounter  ?Procedures  ? EKG 12-Lead  ? ? ? ?Disposition:   FU with me in 12 months.   ? ? ?Signed, ?Minus Breeding, MD  ?03/11/2022 9:25 AM    ?[place] ? ? ?

## 2022-03-11 ENCOUNTER — Encounter: Payer: Self-pay | Admitting: Cardiology

## 2022-03-11 ENCOUNTER — Ambulatory Visit: Payer: Federal, State, Local not specified - PPO | Admitting: Cardiology

## 2022-03-11 ENCOUNTER — Other Ambulatory Visit: Payer: Self-pay

## 2022-03-11 VITALS — BP 138/88 | HR 76 | Ht 67.0 in | Wt 132.0 lb

## 2022-03-11 DIAGNOSIS — Z720 Tobacco use: Secondary | ICD-10-CM | POA: Diagnosis not present

## 2022-03-11 DIAGNOSIS — E7800 Pure hypercholesterolemia, unspecified: Secondary | ICD-10-CM

## 2022-03-11 DIAGNOSIS — Z01419 Encounter for gynecological examination (general) (routine) without abnormal findings: Secondary | ICD-10-CM | POA: Diagnosis not present

## 2022-03-11 DIAGNOSIS — Z6820 Body mass index (BMI) 20.0-20.9, adult: Secondary | ICD-10-CM | POA: Diagnosis not present

## 2022-03-11 DIAGNOSIS — I10 Essential (primary) hypertension: Secondary | ICD-10-CM | POA: Diagnosis not present

## 2022-03-11 DIAGNOSIS — Z1231 Encounter for screening mammogram for malignant neoplasm of breast: Secondary | ICD-10-CM | POA: Diagnosis not present

## 2022-03-11 NOTE — Patient Instructions (Signed)

## 2022-03-13 DIAGNOSIS — L603 Nail dystrophy: Secondary | ICD-10-CM | POA: Diagnosis not present

## 2022-03-13 DIAGNOSIS — B351 Tinea unguium: Secondary | ICD-10-CM | POA: Diagnosis not present

## 2022-04-10 ENCOUNTER — Ambulatory Visit (AMBULATORY_SURGERY_CENTER): Payer: Federal, State, Local not specified - PPO | Admitting: *Deleted

## 2022-04-10 VITALS — Ht 67.0 in | Wt 132.0 lb

## 2022-04-10 DIAGNOSIS — Z86010 Personal history of colonic polyps: Secondary | ICD-10-CM

## 2022-04-10 MED ORDER — NA SULFATE-K SULFATE-MG SULF 17.5-3.13-1.6 GM/177ML PO SOLN: 1.0000 | ORAL | 0 refills | Status: DC

## 2022-04-10 NOTE — Progress Notes (Signed)
Patient's pre-visit was done today over the phone with the patient. Name,DOB and address verified. Patient denies any allergies to Eggs and Soy. Patient denies any problems with anesthesia/sedation. Patient is not taking any diet pills or blood thinners. No home Oxygen. Insurance confirmed with patient. ? ?Prep instructions sent to pt's MyChart (if available)pt-pt is aware. Patient understands to call us back with any questions or concerns. Patient is aware of our care-partner policy. Pt will use [facility] for suprep rx. ? ?EMMI education assigned to the patient for the procedure, sent to [facility].  ? ?The patient is COVID-19 vaccinated.   ?

## 2022-04-15 ENCOUNTER — Other Ambulatory Visit: Payer: Self-pay

## 2022-04-15 MED ORDER — SPIRONOLACTONE 25 MG PO TABS: 25.0000 mg | ORAL_TABLET | Freq: Every day | ORAL | 3 refills | Status: DC

## 2022-04-24 ENCOUNTER — Ambulatory Visit (AMBULATORY_SURGERY_CENTER): Payer: Federal, State, Local not specified - PPO | Admitting: Internal Medicine

## 2022-04-24 ENCOUNTER — Encounter: Payer: Self-pay | Admitting: Internal Medicine

## 2022-04-24 VITALS — BP 123/87 | HR 62 | Temp 97.7°F | Resp 11 | Ht 67.0 in | Wt 132.0 lb

## 2022-04-24 DIAGNOSIS — K5140 Inflammatory polyps of colon without complications: Secondary | ICD-10-CM | POA: Diagnosis not present

## 2022-04-24 DIAGNOSIS — Z86010 Personal history of colonic polyps: Secondary | ICD-10-CM

## 2022-04-24 DIAGNOSIS — D120 Benign neoplasm of cecum: Secondary | ICD-10-CM

## 2022-04-24 DIAGNOSIS — Z1211 Encounter for screening for malignant neoplasm of colon: Secondary | ICD-10-CM | POA: Diagnosis not present

## 2022-04-24 DIAGNOSIS — K635 Polyp of colon: Secondary | ICD-10-CM

## 2022-04-24 MED ORDER — SODIUM CHLORIDE 0.9 % IV SOLN: 500.0000 mL | Freq: Once | INTRAVENOUS | Status: DC

## 2022-04-24 NOTE — Progress Notes (Signed)
Pt's states no medical or surgical changes since previsit or office visit. 

## 2022-04-24 NOTE — Op Note (Signed)
[facility] ?Patient Name: Danielle Williams ?Procedure Date: 04/24/2022 7:51 AM ?MRN: [id number] ?Endoscopist: Jerene Bears , MD ?Age: 61 ?Referring MD:  ?Date of Birth: [birth year] ?Gender: Female ?Account #: [id number] ?Procedure:                Colonoscopy ?Indications:              High risk colon cancer surveillance: Personal  ?                          history of non-advanced adenomas x 2 at last  ?                          colonoscopy: February 2016 ?Medicines:                Monitored Anesthesia Care ?Procedure:                Pre-Anesthesia Assessment: ?                          - Prior to the procedure, a History and Physical  ?                          was performed, and patient medications and  ?                          allergies were reviewed. The patient's tolerance of  ?                          previous anesthesia was also reviewed. The risks  ?                          and benefits of the procedure and the sedation  ?                          options and risks were discussed with the patient.  ?                          All questions were answered, and informed consent  ?                          was obtained. Prior Anticoagulants: The patient has  ?                          taken no previous anticoagulant or antiplatelet  ?                          agents. ASA Grade Assessment: II - A patient with  ?                          mild systemic disease. After reviewing the risks  ?                          and benefits, the patient was deemed in  ?  satisfactory condition to undergo the procedure. ?                          After obtaining informed consent, the colonoscope  ?                          was passed under direct vision. Throughout the  ?                          procedure, the patient's blood pressure, pulse, and  ?                          oxygen saturations were monitored continuously. The  ?                          Olympus PCF-H190DL ([id number]) Colonoscope  was  ?                          introduced through the anus and advanced to the  ?                          cecum, identified by appendiceal orifice and  ?                          ileocecal valve. The colonoscopy was performed  ?                          without difficulty. The patient tolerated the  ?                          procedure well. The quality of the bowel  ?                          preparation was good. The ileocecal valve,  ?                          appendiceal orifice, and rectum were photographed. ?Scope In: 8:25:41 AM ?Scope Out: 8:41:45 AM ?Scope Withdrawal Time: 0 hours 11 minutes 11 seconds  ?Total Procedure Duration: 0 hours 16 minutes 4 seconds  ?Findings:                 The digital rectal exam was normal. ?                          A 5 mm polyp was found in the cecum. The polyp was  ?                          sessile. The polyp was removed with a cold snare.  ?                          Resection and retrieval were complete. ?                          The exam was otherwise without abnormality on  ?  direct and retroflexion views. ?Complications:            No immediate complications. ?Estimated Blood Loss:     Estimated blood loss was minimal. ?Impression:               - One 5 mm polyp in the cecum, removed with a cold  ?                          snare. Resected and retrieved. ?                          - The examination was otherwise normal on direct  ?                          and retroflexion views. ?Recommendation:           - Patient has a contact number available for  ?                          emergencies. The signs and symptoms of potential  ?                          delayed complications were discussed with the  ?                          patient. Return to normal activities tomorrow.  ?                          Written discharge instructions were provided to the  ?                          patient. ?                          - Resume previous diet. ?                           - Continue present medications. ?                          - Await pathology results. ?                          - Repeat colonoscopy is recommended for  ?                          surveillance. The colonoscopy date will be  ?                          determined after pathology results from today's  ?                          exam become available for review. ?Jerene Bears, MD ?04/24/2022 8:46:45 AM ?This report has been signed electronically. ?

## 2022-04-24 NOTE — Progress Notes (Signed)
? ?GASTROENTEROLOGY PROCEDURE H&P NOTE  ? ?Primary Care Physician: ?Biagio Borg, MD ? ? ? ?Reason for Procedure:   Hx of tubular adenoma of colon   ? ?Plan:    colonoscopy ? ?Patient is appropriate for endoscopic procedure(s) in the ambulatory ([facility]) setting. ? ?The nature of the procedure, as well as the risks, benefits, and alternatives were carefully and thoroughly reviewed with the patient. Ample time for discussion and questions allowed. The patient understood, was satisfied, and agreed to proceed.  ? ? ? ?HPI: ?Danielle Williams is a 61 y.o. female who presents for surveillance colonoscopy.  Medical history as below.  Tolerated the prep.  No recent chest pain or shortness of breath.  No abdominal pain today. ? ?Past Medical History:  ?Diagnosis Date  ? Anemia   ? Iron def  ? History of hepatitis   ? Type A  ? Hypertension   ? Recurrent boils   ? Recurrent  ? Vitamin D deficiency   ? ? ?Past Surgical History:  ?Procedure Laterality Date  ? ABDOMINAL HYSTERECTOMY    ? Due to fibroids  ? COLONOSCOPY  02/08/2015  ? Dr.Anamika Kueker  ? POLYPECTOMY    ? ? ?Prior to Admission medications   ?Medication Sig Start Date End Date Taking? Authorizing Provider  ?Biotin 10 MG TABS Take 1,000 mg by mouth daily.   Yes [provider]  ?cholecalciferol (VITAMIN D3) 25 MCG (1000 UNIT) tablet Take 1,000 Units by mouth daily.   Yes [provider]  ?losartan (COZAAR) 50 MG tablet Take 1 tablet (50 mg total) by mouth daily. 02/19/22  Yes Minus Breeding, MD  ?spironolactone (ALDACTONE) 25 MG tablet Take 1 tablet (25 mg total) by mouth daily. 04/15/22  Yes Minus Breeding, MD  ? ? ?Current Outpatient Medications  ?Medication Sig Dispense Refill  ? Biotin 10 MG TABS Take 1,000 mg by mouth daily.    ? cholecalciferol (VITAMIN D3) 25 MCG (1000 UNIT) tablet Take 1,000 Units by mouth daily.    ? losartan (COZAAR) 50 MG tablet Take 1 tablet (50 mg total) by mouth daily. 90 tablet 3  ? spironolactone (ALDACTONE) 25 MG tablet Take 1  tablet (25 mg total) by mouth daily. 90 tablet 3  ? ?Current Facility-Administered Medications  ?Medication Dose Route Frequency Provider Last Rate Last Admin  ? 0.9 %  sodium chloride infusion  500 mL Intravenous Once Geron Mulford, Lajuan Lines, MD      ? ? ?Allergies as of 04/24/2022  ? (No Known Allergies)  ? ? ?Family History  ?Problem Relation Age of Onset  ? Dementia Mother   ?     age 65 now   ? Other Father   ?     muscle disease /black lung   ? Hypertension Sister   ? Diabetes Sister   ? Ovarian cancer Sister   ? Throat cancer Paternal Aunt   ? Alcohol abuse Cousin   ?     Multiple  ? Breast cancer Cousin   ? Arthritis Other   ?     Parent  ? Cancer Other   ?     Breast and ovary-sister and cousin  ? Heart disease Other   ?     Aunt  ? Cancer Other   ?     Brain-nephew   ? Colon cancer Neg Hx   ? Esophageal cancer Neg Hx   ? Rectal cancer Neg Hx   ? Stomach cancer Neg Hx   ? Colon  polyps Neg Hx   ? ? ?Social History  ? ?Socioeconomic History  ? Marital status: Divorced  ?  Spouse name: Not on file  ? Number of children: Not on file  ? Years of education: Not on file  ? Highest education level: Not on file  ?Occupational History  ? Not on file  ?Tobacco Use  ? Smoking status: Every Day  ?  Packs/day: 0.25  ?  Years: 30.00  ?  Pack years: 7.50  ?  Types: Cigarettes  ? Smokeless tobacco: Never  ? Tobacco comments:  ?  She is trying to taper.    ?Vaping Use  ? Vaping Use: Never used  ?Substance and Sexual Activity  ? Alcohol use: Not Currently  ?  Alcohol/week: 7.0 standard drinks  ?  Types: 7 Glasses of wine per week  ?  Comment: socially  ? Drug use: No  ?  Comment: past hx   ? Sexual activity: Not on file  ?Other Topics Concern  ? Not on file  ?Social History Narrative  ? Not on file  ? ?Social Determinants of Health  ? ?Financial Resource Strain: Not on file  ?Food Insecurity: Not on file  ?Transportation Needs: Not on file  ?Physical Activity: Not on file  ?Stress: Not on file  ?Social Connections: Not on file   ?Intimate Partner Violence: Not on file  ? ? ?Physical Exam: ?Vital signs in last 24 hours: ?'@BP'$  135/77   Pulse 73   Temp 97.7 ?F (36.5 ?C) (Temporal)   Ht '5\' 7"'$  (1.702 m)   Wt 132 lb (59.9 kg)   SpO2 100%   BMI 20.67 kg/m?  ?GEN: NAD ?EYE: Sclerae anicteric ?ENT: MMM ?CV: Non-tachycardic ?Pulm: CTA b/l ?GI: Soft, NT/ND ?NEURO:  Alert & Oriented x 3 ? ? ?Zenovia Jarred, MD ?[facility] ? ?04/24/2022 8:17 AM ? ?

## 2022-04-24 NOTE — Progress Notes (Signed)
Report to pacu rn; vss. ?

## 2022-04-24 NOTE — Progress Notes (Signed)
Called to room to assist during endoscopic procedure.  Patient ID and intended procedure confirmed with present staff. Received instructions for my participation in the procedure from the performing physician.  

## 2022-04-24 NOTE — Patient Instructions (Signed)
1 polyp removed- await pathology ? ?Please read over handout about polyps ? ?Continue your normal medications ? ? ?YOU HAD AN ENDOSCOPIC PROCEDURE TODAY AT [facility]:   Refer to the procedure report that was given to you for any specific questions about what was found during the examination.  If the procedure report does not answer your questions, please call your gastroenterologist to clarify.  If you requested that your care partner not be given the details of your procedure findings, then the procedure report has been included in a sealed envelope for you to review at your convenience later. ? ?YOU SHOULD EXPECT: Some feelings of bloating in the abdomen. Passage of more gas than usual.  Walking can help get rid of the air that was put into your GI tract during the procedure and reduce the bloating. If you had a lower endoscopy (such as a colonoscopy or flexible sigmoidoscopy) you may notice spotting of blood in your stool or on the toilet paper. If you underwent a bowel prep for your procedure, you may not have a normal bowel movement for a few days. ? ?Please Note:  You might notice some irritation and congestion in your nose or some drainage.  This is from the oxygen used during your procedure.  There is no need for concern and it should clear up in a day or so. ? ?SYMPTOMS TO REPORT IMMEDIATELY: ? ?Following lower endoscopy (colonoscopy or flexible sigmoidoscopy): ? Excessive amounts of blood in the stool ? Significant tenderness or worsening of abdominal pains ? Swelling of the abdomen that is new, acute ? Fever of 100?F or higher ? ?For urgent or emergent issues, a gastroenterologist can be reached at any hour by calling [phone]. ?Do not use MyChart messaging for urgent concerns.  ? ? ?DIET:  We do recommend a small meal at first, but then you may proceed to your regular diet.  Drink plenty of fluids but you should avoid alcoholic beverages for 24 hours. ? ?ACTIVITY:  You should  plan to take it easy for the rest of today and you should NOT DRIVE or use heavy machinery until tomorrow (because of the sedation medicines used during the test).   ? ?FOLLOW UP: ?Our staff will call the number listed on your records 48-72 hours following your procedure to check on you and address any questions or concerns that you may have regarding the information given to you following your procedure. If we do not reach you, we will leave a message.  We will attempt to reach you two times.  During this call, we will ask if you have developed any symptoms of COVID 19. If you develop any symptoms (ie: fever, flu-like symptoms, shortness of breath, cough etc.) before then, please call [phone].  If you test positive for Covid 19 in the 2 weeks post procedure, please call and report this information to [place].   ? ?If any biopsies were taken you will be contacted by phone or by letter within the next 1-3 weeks.  Please call us at [phone] if you have not heard about the biopsies in 3 weeks.  ? ? ?SIGNATURES/CONFIDENTIALITY: ?You and/or your care partner have signed paperwork which will be entered into your electronic medical record.  These signatures attest to the fact that that the information above on your After Visit Summary has been reviewed and is understood.  Full responsibility of the confidentiality of this discharge information lies with you and/or your care-partner. ? ?

## 2022-04-26 ENCOUNTER — Telehealth: Payer: Self-pay | Admitting: *Deleted

## 2022-04-26 NOTE — Telephone Encounter (Signed)
?  Follow up Call- ? ? ?  04/24/2022  ?  7:43 AM  ?Call back number  ?Post procedure Call Back phone  # [phone]  ?Permission to leave phone message Yes  ?  ? ?Patient questions: ? ?Do you have a fever, pain , or abdominal swelling? No. ?Pain Score  0 * ? ?Have you tolerated food without any problems? Yes.   ? ?Have you been able to return to your normal activities? Yes.   ? ?Do you have any questions about your discharge instructions: ?Diet   No. ?Medications  No. ?Follow up visit  No. ? ?Do you have questions or concerns about your Care? No. ? ?Actions: ?* If pain score is 4 or above: ?No action needed, pain <4. ? ? ?

## 2022-04-29 ENCOUNTER — Encounter: Payer: Self-pay | Admitting: Internal Medicine

## 2023-02-20 ENCOUNTER — Ambulatory Visit: Payer: Federal, State, Local not specified - PPO | Admitting: Internal Medicine

## 2023-02-20 ENCOUNTER — Other Ambulatory Visit: Payer: Self-pay | Admitting: Cardiology

## 2023-02-20 VITALS — BP 130/76 | HR 78 | Temp 97.8°F | Ht 67.0 in | Wt 129.0 lb

## 2023-02-20 DIAGNOSIS — E569 Vitamin deficiency, unspecified: Secondary | ICD-10-CM

## 2023-02-20 DIAGNOSIS — Z720 Tobacco use: Secondary | ICD-10-CM | POA: Diagnosis not present

## 2023-02-20 DIAGNOSIS — I10 Essential (primary) hypertension: Secondary | ICD-10-CM | POA: Diagnosis not present

## 2023-02-20 DIAGNOSIS — H00033 Abscess of eyelid right eye, unspecified eyelid: Secondary | ICD-10-CM | POA: Insufficient documentation

## 2023-02-20 MED ORDER — TRAMADOL HCL 50 MG PO TABS
50.0000 mg | ORAL_TABLET | Freq: Four times a day (QID) | ORAL | 0 refills | Status: DC | PRN
Start: 2023-02-20 — End: 2024-03-03

## 2023-02-20 MED ORDER — SULFAMETHOXAZOLE-TRIMETHOPRIM 800-160 MG PO TABS: 1.0000 | ORAL_TABLET | Freq: Two times a day (BID) | ORAL | 0 refills | Status: DC

## 2023-02-20 NOTE — Progress Notes (Signed)
Patient ID: Danielle Williams, female   DOB: [birth year], 62 y.o.   MRN: [id number]        Chief Complaint: follow up right upper eyelid abscess, htn, low vit d, smoker       HPI:  Danielle Williams is a 62 y.o. female here with c/o 3 days onset rapidly worsening pain swelling redness and small drainage at times after a stye seemed to be present before to right upper mid eyelid.  No vision change, fever, chills, sinus or ear pain or ST.  Pt denies chest pain, increased sob or doe, wheezing, orthopnea, PND, increased LE swelling, palpitations, dizziness or syncope.   Pt denies polydipsia, polyuria, or new focal neuro s/s.   Pt still smoking, not ready to quit.  Pain about 6/10.         Wt Readings from Last 3 Encounters:  02/20/23 129 lb (58.5 kg)  04/24/22 132 lb (59.9 kg)  04/10/22 132 lb (59.9 kg)   BP Readings from Last 3 Encounters:  02/20/23 130/76  04/24/22 123/87  03/11/22 138/88         Past Medical History:  Diagnosis Date   Anemia    Iron def   History of hepatitis    Type A   Hypertension    Recurrent boils    Recurrent   Vitamin D deficiency    Past Surgical History:  Procedure Laterality Date   ABDOMINAL HYSTERECTOMY     Due to fibroids   COLONOSCOPY  02/08/2015   Dr.Pyrtle   POLYPECTOMY      reports that she has been smoking cigarettes. She has a 7.50 pack-year smoking history. She has never used smokeless tobacco. She reports that she does not currently use alcohol after a past usage of about 7.0 standard drinks of alcohol per week. She reports that she does not use drugs. family history includes Alcohol abuse in her cousin; Arthritis in an other family member; Breast cancer in her cousin; Cancer in some other family members; Dementia in her mother; Diabetes in her sister; Heart disease in an other family member; Hypertension in her sister; Other in her father; Ovarian cancer in her sister; Throat cancer in her paternal aunt. No Known Allergies Current Outpatient  Medications on File Prior to Visit  Medication Sig Dispense Refill   Biotin 10 MG TABS Take 1,000 mg by mouth daily.     cholecalciferol (VITAMIN D3) 25 MCG (1000 UNIT) tablet Take 1,000 Units by mouth daily.     spironolactone (ALDACTONE) 25 MG tablet Take 1 tablet (25 mg total) by mouth daily. 90 tablet 3   No current facility-administered medications on file prior to visit.        ROS:  All others reviewed and negative.  Objective        PE:  BP 130/76 (BP Location: Right Arm, Patient Position: Sitting, Cuff Size: Normal)   Pulse 78   Temp 97.8 F (36.6 C) (Oral)   Ht '5\' 7"'$  (1.702 m)   Wt 129 lb (58.5 kg)   SpO2 95%   BMI 20.20 kg/m                 Constitutional: Pt appears in NAD               HENT: Head: NCAT.                Right Ear: External ear normal.  Left Ear: External ear normal.                Eyes: . Pupils are equal, round, and reactive to light. Conjunctivae and EOM are normal, right upper mid eyelid with > 10 mm raised tender red lesion               Nose: without d/c or deformity               Neck: Neck supple. Gross normal ROM               Cardiovascular: Normal rate and regular rhythm.                 Pulmonary/Chest: Effort normal and breath sounds without rales or wheezing.                Abd:  Soft, NT, ND, + BS, no organomegaly               Neurological: Pt is alert. At baseline orientation, motor grossly intact               Skin: Skin is warm. No rashes, no other new lesions, LE edema - none               Psychiatric: Pt behavior is normal without agitation   Micro: none  Cardiac tracings I have personally interpreted today:  none  Pertinent Radiological findings (summarize): none   Lab Results  Component Value Date   WBC 7.3 10/31/2021   HGB 13.2 10/31/2021   HCT 39.4 10/31/2021   PLT 218.0 10/31/2021   GLUCOSE 96 10/31/2021   CHOL 199 10/31/2021   TRIG 82.0 10/31/2021   HDL 88.60 10/31/2021   LDLCALC 94 10/31/2021    ALT 12 10/31/2021   AST 17 10/31/2021   NA 137 10/31/2021   K 3.8 10/31/2021   CL 100 10/31/2021   CREATININE 1.05 10/31/2021   BUN 14 10/31/2021   CO2 29 10/31/2021   TSH 3.54 10/31/2021   Assessment/Plan:  Danielle Williams is a 62 y.o. Black or African American [2] female with  has a past medical history of Anemia, History of hepatitis, Hypertension, Recurrent boils, and Vitamin D deficiency.  Eyelid abscess, right Mild to mod, for antibx course, - septra ds, pain control tramadol prn, refer eye MD asap, to f/u any worsening symptoms or concerns  HTN (hypertension) BP Readings from Last 3 Encounters:  02/20/23 130/76  04/24/22 123/87  03/11/22 138/88   Stable, pt to continue medical treatment losartan 50 mg qd   Tobacco abuse Pt counsled to quit, pt not ready  Vitamin deficiency Last vitamin D Lab Results  Component Value Date   VD25OH 39.75 10/31/2021   Low, reminded to start oral replacement  Followup: Return if symptoms worsen or fail to improve.  Cathlean Cower, MD 02/22/2023 7:42 PM [facility]

## 2023-02-20 NOTE — Patient Instructions (Signed)
Please take all new medication as prescribeds - the antibiotic, and the pain medication if needed  You will be contacted regarding the referral for: Eye doctor asap  Please continue all other medications as before, and refills have been done if requested.  Please have the pharmacy call with any other refills you may need.  Please keep your appointments with your specialists as you may have planned

## 2023-02-22 ENCOUNTER — Encounter: Payer: Self-pay | Admitting: Internal Medicine

## 2023-02-22 NOTE — Assessment & Plan Note (Signed)
Last vitamin D Lab Results  Component Value Date   VD25OH 39.75 10/31/2021   Low, reminded to start oral replacement

## 2023-02-22 NOTE — Assessment & Plan Note (Signed)
Mild to mod, for antibx course, - septra ds, pain control tramadol prn, refer eye MD asap, to f/u any worsening symptoms or concerns

## 2023-02-22 NOTE — Assessment & Plan Note (Signed)
Pt counsled to quit, pt not ready °

## 2023-02-22 NOTE — Assessment & Plan Note (Signed)
BP Readings from Last 3 Encounters:  02/20/23 130/76  04/24/22 123/87  03/11/22 138/88   Stable, pt to continue medical treatment losartan 50 mg qd

## 2023-02-26 DIAGNOSIS — H00021 Hordeolum internum right upper eyelid: Secondary | ICD-10-CM | POA: Diagnosis not present

## 2023-03-05 DIAGNOSIS — H00021 Hordeolum internum right upper eyelid: Secondary | ICD-10-CM | POA: Diagnosis not present

## 2023-03-07 ENCOUNTER — Ambulatory Visit: Payer: Federal, State, Local not specified - PPO | Admitting: Cardiology

## 2023-03-27 NOTE — Progress Notes (Signed)
  Cardiology Office Note:   Date:  03/28/2023  ID:  Danielle Williams, DOB Jun 05, 1961, MRN [id number]  History of Present Illness:   Danielle Williams is a 62 y.o. female who presents for who presents for ongoing assessment and management of hypertension and history of tobacco abuse.   She also has a history of mild mitral valve prolapse.  In 2022 she had a zero coronary calcium score.     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  The patient denies any new symptoms such as chest discomfort, neck or arm discomfort. There has been no new shortness of breath, PND or orthopnea. There have been no reported palpitations, presyncope or syncope.     ROS: Positive for cramping.  Otherwise as stated in the HPI and negative for all other systems.  Studies Reviewed:    EKG: Sinus rhythm, rate 75, left axis deviation, incomplete right bundle branch block, no acute ST-T wave changes.  No change from previous  Risk Assessment/Calculations:     Physical Exam:   VS:  BP 122/80 (BP Location: Left Arm, Patient Position: Sitting, Cuff Size: Normal)   Pulse 75   Ht 5\' 7"  (1.702 m)   Wt 129 lb 12.8 oz (58.9 kg)   SpO2 94%   BMI 20.33 kg/m    Wt Readings from Last 3 Encounters:  03/28/23 129 lb 12.8 oz (58.9 kg)  02/20/23 129 lb (58.5 kg)  04/24/22 132 lb (59.9 kg)     GEN: Well nourished, well developed in no acute distress NECK: No JVD; No carotid bruits CARDIAC: RRR, no murmurs, rubs, gallops, positive mitral valve prolapse click RESPIRATORY:  Clear to auscultation without rales, wheezing or rhonchi  ABDOMEN: Soft, non-tender, non-distended EXTREMITIES:  No edema; No deformity   ASSESSMENT AND PLAN:   HTN:   Her blood pressure is at target.  No change in therapy.   TOBACCO ABUSE:    We again talked about the need to stop smoking.   MVP: This was mild years ago on an echo.  I will check an echocardiogram  WEIGHT LOSS: She has had some mild weight loss is that she eats okay.  I am  going to check labs to include TSH, CBC, magnesium, c-Met.      Signed, Rollene Rotunda, MD

## 2023-03-28 ENCOUNTER — Encounter: Payer: Self-pay | Admitting: Cardiology

## 2023-03-28 ENCOUNTER — Ambulatory Visit: Payer: Federal, State, Local not specified - PPO | Attending: Cardiology | Admitting: Cardiology

## 2023-03-28 VITALS — BP 122/80 | HR 75 | Ht 67.0 in | Wt 129.8 lb

## 2023-03-28 DIAGNOSIS — I10 Essential (primary) hypertension: Secondary | ICD-10-CM

## 2023-03-28 DIAGNOSIS — Z720 Tobacco use: Secondary | ICD-10-CM | POA: Diagnosis not present

## 2023-03-28 DIAGNOSIS — I341 Nonrheumatic mitral (valve) prolapse: Secondary | ICD-10-CM | POA: Diagnosis not present

## 2023-03-28 NOTE — Patient Instructions (Addendum)
Medication Instructions:   Not needed *If you need a refill on your cardiac medications before your next appointment, please call your pharmacy*   Lab Work: CMP TSH  Magnesium  CBC If you have labs (blood work) drawn today and your tests are completely normal, you will receive your results only by: MyChart Message (if you have MyChart) OR A paper copy in the mail If you have any lab test that is abnormal or we need to change your treatment, we will call you to review the results.   Testing/Procedures:   Will be schedule Your physician has requested that you have an echocardiogram. Echocardiography is a painless test that uses sound waves to create images of your heart. It provides your doctor with information about the size and shape of your heart and how well your heart's chambers and valves are working. This procedure takes approximately one hour. There are no restrictions for this procedure. Please do NOT wear cologne, perfume, aftershave, or lotions (deodorant is allowed). Please arrive 15 minutes prior to your appointment time.   Follow-Up: At [facility], you and your health needs are our priority.  As part of our continuing mission to provide you with exceptional heart care, we have created designated Provider Care Teams.  These Care Teams include your primary Cardiologist (physician) and Advanced Practice Providers (APPs -  Physician Assistants and Nurse Practitioners) who all work together to provide you with the care you need, when you need it.     Your next appointment:   12 month(s)  The format for your next appointment:   In Person  Provider:   Rollene Rotunda, MD    Other Instructions  Your physician discussed the hazards of tobacco use. Tobacco use cessation is recommended and techniques and options to help you quit were discussed.

## 2023-03-29 LAB — COMPREHENSIVE METABOLIC PANEL
ALT: 17 IU/L (ref 0–32)
AST: 24 IU/L (ref 0–40)
Albumin/Globulin Ratio: 1.8 (ref 1.2–2.2)
Albumin: 4.7 g/dL (ref 3.9–4.9)
Alkaline Phosphatase: 89 IU/L (ref 44–121)
BUN/Creatinine Ratio: 12 (ref 12–28)
BUN: 11 mg/dL (ref 8–27)
Bilirubin Total: 0.2 mg/dL (ref 0.0–1.2)
CO2: 22 mmol/L (ref 20–29)
Calcium: 9.8 mg/dL (ref 8.7–10.3)
Chloride: 102 mmol/L (ref 96–106)
Creatinine, Ser: 0.9 mg/dL (ref 0.57–1.00)
Globulin, Total: 2.6 g/dL (ref 1.5–4.5)
Glucose: 70 mg/dL (ref 70–99)
Potassium: 4.4 mmol/L (ref 3.5–5.2)
Sodium: 139 mmol/L (ref 134–144)
Total Protein: 7.3 g/dL (ref 6.0–8.5)
eGFR: 73 mL/min/{1.73_m2} (ref >59)

## 2023-03-29 LAB — CBC
Hematocrit: 44.5 % (ref 34.0–46.6)
Hemoglobin: 14.5 g/dL (ref 11.1–15.9)
MCH: 30 pg (ref 26.6–33.0)
MCHC: 32.6 g/dL (ref 31.5–35.7)
MCV: 92 fL (ref 79–97)
Platelets: 227 10*3/uL (ref 150–450)
RBC: 4.83 x10E6/uL (ref 3.77–5.28)
RDW: 12.5 % (ref 11.7–15.4)
WBC: 5.6 10*3/uL (ref 3.4–10.8)

## 2023-03-29 LAB — MAGNESIUM: Magnesium: 2.2 mg/dL (ref 1.6–2.3)

## 2023-03-29 LAB — TSH: TSH: 4.69 u[IU]/mL — ABNORMAL HIGH (ref 0.450–4.500)

## 2023-04-01 DIAGNOSIS — Z1231 Encounter for screening mammogram for malignant neoplasm of breast: Secondary | ICD-10-CM | POA: Diagnosis not present

## 2023-04-01 DIAGNOSIS — Z6820 Body mass index (BMI) 20.0-20.9, adult: Secondary | ICD-10-CM | POA: Diagnosis not present

## 2023-04-01 DIAGNOSIS — Z01419 Encounter for gynecological examination (general) (routine) without abnormal findings: Secondary | ICD-10-CM | POA: Diagnosis not present

## 2023-04-02 ENCOUNTER — Encounter: Payer: Self-pay | Admitting: *Deleted

## 2023-04-13 ENCOUNTER — Other Ambulatory Visit: Payer: Self-pay | Admitting: Cardiology

## 2023-04-28 ENCOUNTER — Encounter: Payer: Self-pay | Admitting: Internal Medicine

## 2023-04-29 ENCOUNTER — Ambulatory Visit (HOSPITAL_COMMUNITY): Payer: Federal, State, Local not specified - PPO | Attending: Internal Medicine

## 2023-04-29 ENCOUNTER — Encounter: Payer: Self-pay | Admitting: Cardiology

## 2023-04-29 DIAGNOSIS — I341 Nonrheumatic mitral (valve) prolapse: Secondary | ICD-10-CM

## 2023-04-29 DIAGNOSIS — I10 Essential (primary) hypertension: Secondary | ICD-10-CM | POA: Diagnosis not present

## 2023-04-29 LAB — ECHOCARDIOGRAM COMPLETE
Area-P 1/2: 3.02 cm2
S' Lateral: 3.2 cm

## 2023-08-13 DIAGNOSIS — M545 Low back pain, unspecified: Secondary | ICD-10-CM | POA: Diagnosis not present

## 2023-08-20 ENCOUNTER — Encounter (HOSPITAL_COMMUNITY): Payer: Self-pay

## 2023-09-03 DIAGNOSIS — M5106 Intervertebral disc disorders with myelopathy, lumbar region: Secondary | ICD-10-CM | POA: Diagnosis not present

## 2023-09-03 DIAGNOSIS — M25551 Pain in right hip: Secondary | ICD-10-CM | POA: Diagnosis not present

## 2023-09-11 DIAGNOSIS — M545 Low back pain, unspecified: Secondary | ICD-10-CM | POA: Diagnosis not present

## 2023-09-24 DIAGNOSIS — M47816 Spondylosis without myelopathy or radiculopathy, lumbar region: Secondary | ICD-10-CM | POA: Diagnosis not present

## 2024-03-02 ENCOUNTER — Other Ambulatory Visit: Payer: Self-pay | Admitting: Cardiology

## 2024-03-03 ENCOUNTER — Ambulatory Visit (INDEPENDENT_AMBULATORY_CARE_PROVIDER_SITE_OTHER): Admitting: Internal Medicine

## 2024-03-03 ENCOUNTER — Encounter: Payer: Self-pay | Admitting: Internal Medicine

## 2024-03-03 VITALS — BP 120/98 | HR 60 | Temp 98.2°F | Ht 67.0 in | Wt 122.0 lb

## 2024-03-03 DIAGNOSIS — Z0000 Encounter for general adult medical examination without abnormal findings: Secondary | ICD-10-CM | POA: Diagnosis not present

## 2024-03-03 DIAGNOSIS — E611 Iron deficiency: Secondary | ICD-10-CM | POA: Diagnosis not present

## 2024-03-03 DIAGNOSIS — I10 Essential (primary) hypertension: Secondary | ICD-10-CM

## 2024-03-03 DIAGNOSIS — Z0001 Encounter for general adult medical examination with abnormal findings: Secondary | ICD-10-CM

## 2024-03-03 DIAGNOSIS — E569 Vitamin deficiency, unspecified: Secondary | ICD-10-CM | POA: Diagnosis not present

## 2024-03-03 DIAGNOSIS — R739 Hyperglycemia, unspecified: Secondary | ICD-10-CM | POA: Diagnosis not present

## 2024-03-03 DIAGNOSIS — E538 Deficiency of other specified B group vitamins: Secondary | ICD-10-CM

## 2024-03-03 DIAGNOSIS — F419 Anxiety disorder, unspecified: Secondary | ICD-10-CM | POA: Diagnosis not present

## 2024-03-03 DIAGNOSIS — Z720 Tobacco use: Secondary | ICD-10-CM | POA: Diagnosis not present

## 2024-03-03 DIAGNOSIS — E7800 Pure hypercholesterolemia, unspecified: Secondary | ICD-10-CM

## 2024-03-03 DIAGNOSIS — F32A Depression, unspecified: Secondary | ICD-10-CM

## 2024-03-03 LAB — URINALYSIS, ROUTINE W REFLEX MICROSCOPIC
Bilirubin Urine: NEGATIVE
Hgb urine dipstick: NEGATIVE
Ketones, ur: NEGATIVE
Leukocytes,Ua: NEGATIVE
Nitrite: NEGATIVE
Specific Gravity, Urine: 1.02 (ref 1.000–1.030)
Total Protein, Urine: NEGATIVE
Urine Glucose: NEGATIVE
Urobilinogen, UA: 0.2 (ref 0.0–1.0)
pH: 6 (ref 5.0–8.0)

## 2024-03-03 LAB — CBC WITH DIFFERENTIAL/PLATELET
Basophils Absolute: 0 10*3/uL (ref 0.0–0.1)
Basophils Relative: 0.4 % (ref 0.0–3.0)
Eosinophils Absolute: 0.1 10*3/uL (ref 0.0–0.7)
Eosinophils Relative: 1.2 % (ref 0.0–5.0)
HCT: 38.9 % (ref 36.0–46.0)
Hemoglobin: 13 g/dL (ref 12.0–15.0)
Lymphocytes Relative: 44.5 % (ref 12.0–46.0)
Lymphs Abs: 3 10*3/uL (ref 0.7–4.0)
MCHC: 33.6 g/dL (ref 30.0–36.0)
MCV: 89.6 fl (ref 78.0–100.0)
Monocytes Absolute: 0.4 10*3/uL (ref 0.1–1.0)
Monocytes Relative: 6.3 % (ref 3.0–12.0)
Neutro Abs: 3.2 10*3/uL (ref 1.4–7.7)
Neutrophils Relative %: 47.6 % (ref 43.0–77.0)
Platelets: 226 10*3/uL (ref 150.0–400.0)
RBC: 4.34 Mil/uL (ref 3.87–5.11)
RDW: 13.8 % (ref 11.5–15.5)
WBC: 6.8 10*3/uL (ref 4.0–10.5)

## 2024-03-03 LAB — VITAMIN D 25 HYDROXY (VIT D DEFICIENCY, FRACTURES): VITD: 28.61 ng/mL — ABNORMAL LOW (ref 30.00–100.00)

## 2024-03-03 LAB — HEPATIC FUNCTION PANEL
ALT: 12 U/L (ref 0–35)
AST: 19 U/L (ref 0–37)
Albumin: 4.5 g/dL (ref 3.5–5.2)
Alkaline Phosphatase: 61 U/L (ref 39–117)
Bilirubin, Direct: 0.1 mg/dL (ref 0.0–0.3)
Total Bilirubin: 0.5 mg/dL (ref 0.2–1.2)
Total Protein: 7.1 g/dL (ref 6.0–8.3)

## 2024-03-03 LAB — IBC PANEL
Iron: 87 ug/dL (ref 42–145)
Saturation Ratios: 22.1 % (ref 20.0–50.0)
TIBC: 393.4 ug/dL (ref 250.0–450.0)
Transferrin: 281 mg/dL (ref 212.0–360.0)

## 2024-03-03 LAB — BASIC METABOLIC PANEL
BUN: 10 mg/dL (ref 6–23)
CO2: 29 meq/L (ref 19–32)
Calcium: 9.7 mg/dL (ref 8.4–10.5)
Chloride: 101 meq/L (ref 96–112)
Creatinine, Ser: 0.76 mg/dL (ref 0.40–1.20)
GFR: 83.84 mL/min (ref >60.00)
Glucose, Bld: 95 mg/dL (ref 70–99)
Potassium: 3.9 meq/L (ref 3.5–5.1)
Sodium: 136 meq/L (ref 135–145)

## 2024-03-03 LAB — LIPID PANEL
Cholesterol: 182 mg/dL (ref 0–200)
HDL: 90.3 mg/dL (ref >39.00)
LDL Cholesterol: 80 mg/dL (ref 0–99)
NonHDL: 91.77
Total CHOL/HDL Ratio: 2
Triglycerides: 60 mg/dL (ref 0.0–149.0)
VLDL: 12 mg/dL (ref 0.0–40.0)

## 2024-03-03 LAB — VITAMIN B12: Vitamin B-12: 297 pg/mL (ref 211–911)

## 2024-03-03 LAB — TSH: TSH: 2.77 u[IU]/mL (ref 0.35–5.50)

## 2024-03-03 LAB — FERRITIN: Ferritin: 71.3 ng/mL (ref 10.0–291.0)

## 2024-03-03 LAB — HEMOGLOBIN A1C: Hgb A1c MFr Bld: 5.9 % (ref 4.6–6.5)

## 2024-03-03 NOTE — Patient Instructions (Addendum)
 Please have your Shingrix (shingles) shots done at your local pharmacy., and the Pneumococal 21 shot  Please continue all other medications as before, and refills have been done if requested.  Please have the pharmacy call with any other refills you may need.  Please continue your efforts at being more active, low cholesterol diet, and weight control.  You are otherwise up to date with prevention measures today.  Please keep your appointments with your specialists as you may have planned  Please call if you would want the referral to Dr Ranell Patrick for the right shoulder  Please go to the LAB at the blood drawing area for the tests to be done  You will be contacted by phone if any changes need to be made immediately.  Otherwise, you will receive a letter about your results with an explanation, but please check with MyChart first.  Please make an Appointment to return for your 1 year visit, or sooner if needed

## 2024-03-03 NOTE — Progress Notes (Signed)
 Patient ID: Danielle Williams, female   DOB: 10-27-1961, 63 y.o.   MRN: [id number]         Chief Complaint:: wellness exam and Annual Exam (Headaches and losing weight)  , low vit d, htn, hld, anxiety depression       HPI:  Danielle Williams is a 63 y.o. female here for wellness exam; has mammogram sched for next month, also cardiology soon, declines pneumovax and shingrix for now, o/w up to date                        Also Denies worsening depressive symptoms, suicidal ideation, or panic; has ongoing anxiety.  Pt denies chest pain, increased sob or doe, wheezing, orthopnea, PND, increased LE swelling, palpitations, dizziness or syncope.   Pt denies polydipsia, polyuria, or new focal neuro s/s, but does have recurring chronic headaches, hard to gain wt with anxiety.    Pt denies fever, wt loss, night sweats, loss of appetite, or other constitutional symptoms  Has not taken her BP med this am yet.  Also has recurring mild right shoulder pain not yet evaluated but declines for now.    Wt Readings from Last 3 Encounters:  03/03/24 122 lb (55.3 kg)  03/28/23 129 lb 12.8 oz (58.9 kg)  02/20/23 129 lb (58.5 kg)   BP Readings from Last 3 Encounters:  03/03/24 (!) 120/98  03/28/23 122/80  02/20/23 130/76   Immunization History  Administered Date(s) Administered   Influenza Inj Mdck Quad With Preservative 09/28/2018, 09/16/2019   Influenza, Seasonal, Injecte, Preservative Fre 10/16/2013   Influenza-Unspecified 10/16/2014, 09/16/2015, 10/31/2015, 09/17/2016, 09/15/2020, 08/16/2021, 10/10/2021, 10/23/2022   PFIZER(Purple Top)SARS-COV-2 Vaccination 02/24/2020, 03/21/2020, 10/21/2020, 05/04/2021, 10/23/2022   Pfizer Covid-19 Vaccine Bivalent Booster 5y-11y 10/10/2021   Pfizer(Comirnaty)Fall Seasonal Vaccine 12 years and older 10/01/2023   Td 10/17/2008   Tdap 11/16/2015   Health Maintenance Due  Topic Date Due   Pneumococcal Vaccine 58-22 Years old (1 of 2 - PCV) Never done   Zoster Vaccines- Shingrix  (1 of 2) Never done   MAMMOGRAM  02/21/2023      Past Medical History:  Diagnosis Date   Anemia    Iron def   Arthritis    History of hepatitis    Type A   Hypertension    Recurrent boils    Recurrent   Vitamin D deficiency    Past Surgical History:  Procedure Laterality Date   ABDOMINAL HYSTERECTOMY     Due to fibroids   COLONOSCOPY  02/08/2015   Dr.Pyrtle   POLYPECTOMY      reports that she has been smoking cigarettes. She has a 7.5 pack-year smoking history. She has never used smokeless tobacco. She reports current alcohol use of about 7.0 standard drinks of alcohol per week. She reports that she does not use drugs. family history includes Alcohol abuse in her brother, cousin, and father; Arthritis in her mother, sister, sister, and another family member; Breast cancer in her cousin; Cancer in her brother, sister, and other family members; Dementia in her mother; Diabetes in her sister; Heart disease in an other family member; Hypertension in her sister; Other in her father; Ovarian cancer in her sister; Throat cancer in her paternal aunt. No Known Allergies Current Outpatient Medications on File Prior to Visit  Medication Sig Dispense Refill   Biotin 10 MG TABS Take 1,000 mg by mouth daily.     Docusate Calcium (STOOL SOFTENER PO) Take by mouth.  losartan (COZAAR) 50 MG tablet TAKE 1 TABLET BY MOUTH EVERY DAY 30 tablet 0   spironolactone (ALDACTONE) 25 MG tablet TAKE 1 TABLET (25 MG TOTAL) BY MOUTH DAILY. 90 tablet 3   No current facility-administered medications on file prior to visit.        ROS:  All others reviewed and negative.  Objective        PE:  BP (!) 120/98 (BP Location: Left Arm, Patient Position: Sitting, Cuff Size: Normal)   Pulse 60   Temp 98.2 F (36.8 C) (Oral)   Ht 5\' 7"  (1.702 m)   Wt 122 lb (55.3 kg)   SpO2 95%   BMI 19.11 kg/m                 Constitutional: Pt appears in NAD               HENT: Head: NCAT.                Right Ear:  External ear normal.                 Left Ear: External ear normal.                Eyes: . Pupils are equal, round, and reactive to light. Conjunctivae and EOM are normal               Nose: without d/c or deformity               Neck: Neck supple. Gross normal ROM               Cardiovascular: Normal rate and regular rhythm.                 Pulmonary/Chest: Effort normal and breath sounds without rales or wheezing.                Abd:  Soft, NT, ND, + BS, no organomegaly               Neurological: Pt is alert. At baseline orientation, motor grossly intact               Skin: Skin is warm. No rashes, no other new lesions, LE edema - none               Psychiatric: Pt behavior is normal without agitation   Micro: none  Cardiac tracings I have personally interpreted today:  none  Pertinent Radiological findings (summarize): none   Lab Results  Component Value Date   WBC 6.8 03/03/2024   HGB 13.0 03/03/2024   HCT 38.9 03/03/2024   PLT 226.0 03/03/2024   GLUCOSE 95 03/03/2024   CHOL 182 03/03/2024   TRIG 60.0 03/03/2024   HDL 90.30 03/03/2024   LDLCALC 80 03/03/2024   ALT 12 03/03/2024   AST 19 03/03/2024   NA 136 03/03/2024   K 3.9 03/03/2024   CL 101 03/03/2024   CREATININE 0.76 03/03/2024   BUN 10 03/03/2024   CO2 29 03/03/2024   TSH 2.77 03/03/2024   HGBA1C 5.9 03/03/2024   Assessment/Plan:  Danielle Williams is a 63 y.o. Black or African American [2] female with  has a past medical history of Anemia, Arthritis, History of hepatitis, Hypertension, Recurrent boils, and Vitamin D deficiency.  Encounter for well adult exam with abnormal findings Age and sex appropriate education and counseling updated with regular exercise and diet Referrals for preventative services - for mammogram and cardiology  next month Immunizations addressed - declines pneumovax and shingrix Smoking counseling  - pt counsled to quit, pt not ready Evidence for depression or other mood disorder -  chronic anxiety stable Most recent labs reviewed. I have personally reviewed and have noted: 1) the patient's medical and social history 2) The patient's current medications and supplements 3) The patient's height, weight, and BMI have been recorded in the chart   Vitamin deficiency Last vitamin D Lab Results  Component Value Date   VD25OH 28.61 (L) 03/03/2024   Low, to start oral replacement  Lab Results  Component Value Date   VITAMINB12 297 03/03/2024   Low, to start oral replacement - b12 1000 mcg qd   Tobacco abuse Pt counsled to quit, pt not ready  HTN (hypertension) BP Readings from Last 3 Encounters:  03/03/24 (!) 120/98  03/28/23 122/80  02/20/23 130/76   Uncontrolled, likely reactive today, BP ok at home per pt, pt to continue medical treatment losartan 50 qd   HLD (hyperlipidemia) Lab Results  Component Value Date   LDLCALC 80 03/03/2024   Stable, pt to continue lower chol diet  Anxiety and depression At least mild to mod without panic, declines specific tx such as ssri  Followup: Return in about 1 year (around 03/03/2025).  Oliver Barre, MD 03/06/2024 4:33 PM [facility]  Primary Care - [facility] Internal Medicine

## 2024-03-06 ENCOUNTER — Encounter: Payer: Self-pay | Admitting: Internal Medicine

## 2024-03-06 NOTE — Assessment & Plan Note (Signed)
 BP Readings from Last 3 Encounters:  03/03/24 (!) 120/98  03/28/23 122/80  02/20/23 130/76   Uncontrolled, likely reactive today, BP ok at home per pt, pt to continue medical treatment losartan 50 qd

## 2024-03-06 NOTE — Assessment & Plan Note (Signed)
 Pt counsled to quit, pt not ready

## 2024-03-06 NOTE — Assessment & Plan Note (Signed)
 Age and sex appropriate education and counseling updated with regular exercise and diet Referrals for preventative services - for mammogram and cardiology next month Immunizations addressed - declines pneumovax and shingrix Smoking counseling  - pt counsled to quit, pt not ready Evidence for depression or other mood disorder - chronic anxiety stable Most recent labs reviewed. I have personally reviewed and have noted: 1) the patient's medical and social history 2) The patient's current medications and supplements 3) The patient's height, weight, and BMI have been recorded in the chart

## 2024-03-06 NOTE — Assessment & Plan Note (Signed)
 At least mild to mod without panic, declines specific tx such as ssri

## 2024-03-06 NOTE — Assessment & Plan Note (Signed)
 Lab Results  Component Value Date   LDLCALC 80 03/03/2024   Stable, pt to continue lower chol diet

## 2024-03-06 NOTE — Assessment & Plan Note (Signed)
 Last vitamin D Lab Results  Component Value Date   VD25OH 28.61 (L) 03/03/2024   Low, to start oral replacement  Lab Results  Component Value Date   VITAMINB12 297 03/03/2024   Low, to start oral replacement - b12 1000 mcg qd

## 2024-03-29 ENCOUNTER — Other Ambulatory Visit: Payer: Self-pay | Admitting: Cardiology

## 2024-04-07 NOTE — Progress Notes (Unsigned)
  Cardiology Office Note:   Date:  04/08/2024  ID:  Shelena Castelluccio, DOB Apr 22, 1961, MRN [id number] PCP: Roslyn Coombe, MD  [facility] Providers Cardiologist:  Eilleen Grates, MD {  History of Present Illness:   Danielle Williams is a 63 y.o. female who presents for who presents for ongoing assessment and management of hypertension and history of tobacco abuse.   She also has a history of mild mitral valve prolapse.  In 2022 she had a zero coronary calcium score.     Since I last saw her she denies any new cardiovascular symptoms. The patient denies any new symptoms such as chest discomfort, neck or arm discomfort. There has been no new shortness of breath, PND or orthopnea. There have been no reported palpitations, presyncope or syncope. She stays active delivering mail.    ROS: As stated in the HPI and negative for all other systems.  Studies Reviewed:    EKG:   EKG Interpretation Date/Time:  Thursday April 08 2024 11:06:11 EDT Ventricular Rate:  67 PR Interval:  178 QRS Duration:  90 QT Interval:  414 QTC Calculation: 437 R Axis:   -24  Text Interpretation: Normal sinus rhythm RSR' or QR pattern in V1 suggests right ventricular conduction delay No significant change since last tracing Confirmed by Eilleen Grates ([id number]) on 04/08/2024 11:11:47 AM    Risk Assessment/Calculations:              Physical Exam:   VS:  BP 110/80 (BP Location: Right Arm, Patient Position: Sitting, Cuff Size: Normal)   Pulse 67   Ht 5\' 7"  (1.702 m)   Wt 124 lb (56.2 kg)   SpO2 97%   BMI 19.42 kg/m    Wt Readings from Last 3 Encounters:  04/08/24 124 lb (56.2 kg)  03/03/24 122 lb (55.3 kg)  03/28/23 129 lb 12.8 oz (58.9 kg)     GEN: Well nourished, well developed in no acute distress NECK: No JVD; No carotid bruits CARDIAC: RRR, soft late apical systolic murmurs, rubs, gallops, positive mitral valve prolapse click RESPIRATORY:  Clear to auscultation without rales, wheezing or rhonchi   ABDOMEN: Soft, non-tender, non-distended EXTREMITIES:  No edema; No deformity   ASSESSMENT AND PLAN:   HTN:   The blood pressure is at target. No change in medications is indicated. We will continue with therapeutic lifestyle changes (TLC).  She is up-to-date with follow-up.   TOBACCO ABUSE:   We have talked multiple times about stopping smoking.   MVP: This was borderline in 2024.  I will follow this clinically and with repeat echo in a few years probably 2029 at the latest.   Follow up with me in one year.   Signed, Eilleen Grates, MD

## 2024-04-08 ENCOUNTER — Ambulatory Visit: Payer: Federal, State, Local not specified - PPO | Attending: Cardiology | Admitting: Cardiology

## 2024-04-08 ENCOUNTER — Encounter: Payer: Self-pay | Admitting: Cardiology

## 2024-04-08 VITALS — BP 110/80 | HR 67 | Ht 67.0 in | Wt 124.0 lb

## 2024-04-08 DIAGNOSIS — I10 Essential (primary) hypertension: Secondary | ICD-10-CM | POA: Diagnosis not present

## 2024-04-08 NOTE — Patient Instructions (Signed)
 Medication Instructions:  Continue current medication *If you need a refill on your cardiac medications before your next appointment, please call your pharmacy*  Lab Work: none If you have labs (blood work) drawn today and your tests are completely normal, you will receive your results only by: MyChart Message (if you have MyChart) OR A paper copy in the mail If you have any lab test that is abnormal or we need to change your treatment, we will call you to review the results.  Testing/Procedures:   Follow-Up: At [facility], you and your health needs are our priority.  As part of our continuing mission to provide you with exceptional heart care, our providers are all part of one team.  This team includes your primary Cardiologist (physician) and Advanced Practice Providers or APPs (Physician Assistants and Nurse Practitioners) who all work together to provide you with the care you need, when you need it.  Your next appointment:   1 year(s)  Provider:   Eilleen Grates, MD     We recommend signing up for the patient portal called "MyChart".  Sign up information is provided on this After Visit Summary.  MyChart is used to connect with patients for Virtual Visits (Telemedicine).  Patients are able to view lab/test results, encounter notes, upcoming appointments, etc.  Non-urgent messages can be sent to your provider as well.   To learn more about what you can do with MyChart, go to [email or web address].   Other Instructions none      1st Floor: - Lobby - Registration  - Pharmacy  - Lab - Cafe  2nd Floor: - PV Lab - Diagnostic Testing (echo, CT, nuclear med)  3rd Floor: - Vacant  4th Floor: - TCTS (cardiothoracic surgery) - AFib Clinic - Structural Heart Clinic - Vascular Surgery  - Vascular Ultrasound  5th Floor: - HeartCare Cardiology (general and EP) - Clinical Pharmacy for coumadin, hypertension, lipid, weight-loss medications, and med management  appointments    Valet parking services will be available as well.

## 2024-04-14 DIAGNOSIS — Z01419 Encounter for gynecological examination (general) (routine) without abnormal findings: Secondary | ICD-10-CM | POA: Diagnosis not present

## 2024-04-14 DIAGNOSIS — Z1231 Encounter for screening mammogram for malignant neoplasm of breast: Secondary | ICD-10-CM | POA: Diagnosis not present

## 2024-05-23 ENCOUNTER — Other Ambulatory Visit: Payer: Self-pay | Admitting: Cardiology

## 2024-06-29 ENCOUNTER — Other Ambulatory Visit: Payer: Self-pay | Admitting: Cardiology

## 2024-11-18 ENCOUNTER — Encounter: Payer: Self-pay | Admitting: Internal Medicine

## 2024-11-18 ENCOUNTER — Ambulatory Visit: Admitting: Internal Medicine

## 2024-11-18 VITALS — BP 124/76 | HR 69 | Temp 98.8°F | Ht 67.0 in | Wt 117.0 lb

## 2024-11-18 DIAGNOSIS — Z720 Tobacco use: Secondary | ICD-10-CM

## 2024-11-18 DIAGNOSIS — E538 Deficiency of other specified B group vitamins: Secondary | ICD-10-CM | POA: Insufficient documentation

## 2024-11-18 DIAGNOSIS — E7800 Pure hypercholesterolemia, unspecified: Secondary | ICD-10-CM | POA: Diagnosis not present

## 2024-11-18 DIAGNOSIS — R1010 Upper abdominal pain, unspecified: Secondary | ICD-10-CM | POA: Diagnosis not present

## 2024-11-18 DIAGNOSIS — E559 Vitamin D deficiency, unspecified: Secondary | ICD-10-CM | POA: Diagnosis not present

## 2024-11-18 NOTE — Assessment & Plan Note (Signed)
 Lab Results  Component Value Date   LDLCALC 80 03/03/2024   Stable, pt to continue low chol diet,

## 2024-11-18 NOTE — Progress Notes (Signed)
 Patient ID: Danielle Williams, female   DOB: 02-18-1961, 63 y.o.   MRN: [id number]        Chief Complaint: follow up low vit d and b12, mid/upper abd pain, smoker, hld       HPI:  Danielle Williams is a 63 y.o. female here with mention of intermittent mild pain to mid / upper abd , not better with antacid, without nausea, vomiting, fever, and Denies worsening reflux, dysphagia, bowel change or blood.  Denies urinary symptoms such as dysuria, frequency, urgency, flank pain, hematuria or n/v, fever, chills.  Pt denies chest pain, increased sob or doe, wheezing, orthopnea, PND, increased LE swelling, palpitations, dizziness or syncope.   Pt denies polydipsia, polyuria, or new focal neuro s/s.   Still smoking, not ready to quit       Wt Readings from Last 3 Encounters:  11/18/24 117 lb (53.1 kg)  04/08/24 124 lb (56.2 kg)  03/03/24 122 lb (55.3 kg)   BP Readings from Last 3 Encounters:  11/18/24 124/76  04/08/24 110/80  03/03/24 (!) 120/98         Past Medical History:  Diagnosis Date   Anemia    Iron def   Arthritis    History of hepatitis    Type A   Hypertension    Recurrent boils    Recurrent   Vitamin D  deficiency    Past Surgical History:  Procedure Laterality Date   ABDOMINAL HYSTERECTOMY     Due to fibroids   COLONOSCOPY  02/08/2015   Dr.Pyrtle   POLYPECTOMY      reports that she has been smoking cigarettes. She has a 7.5 pack-year smoking history. She has never used smokeless tobacco. She reports current alcohol use of about 7.0 standard drinks of alcohol per week. She reports that she does not use drugs. family history includes Alcohol abuse in her brother, cousin, and father; Arthritis in her mother, sister, sister, and another family member; Breast cancer in her cousin; Cancer in her brother, sister, and other family members; Dementia in her mother; Diabetes in her sister; Heart disease in an other family member; Hypertension in her sister; Other in her father; Ovarian cancer  in her sister; Throat cancer in her paternal aunt. No Known Allergies Current Outpatient Medications on File Prior to Visit  Medication Sig Dispense Refill   Biotin 10 MG TABS Take 1,000 mg by mouth daily.     Cholecalciferol (VITAMIN D3) 10 MCG (400 UNIT) CAPS Take by mouth daily.     Docusate Calcium (STOOL SOFTENER PO) Take by mouth.     losartan  (COZAAR ) 50 MG tablet TAKE 1 TABLET BY MOUTH EVERY DAY 90 tablet 2   spironolactone  (ALDACTONE ) 25 MG tablet TAKE 1 TABLET (25 MG TOTAL) BY MOUTH DAILY. 90 tablet 3   No current facility-administered medications on file prior to visit.        ROS:  All others reviewed and negative.  Objective        PE:  BP 124/76 (BP Location: Right Arm, Patient Position: Sitting, Cuff Size: Normal)   Pulse 69   Temp 98.8 F (37.1 C) (Oral)   Ht 5' 7 (1.702 m)   Wt 117 lb (53.1 kg)   SpO2 99%   BMI 18.32 kg/m                 Constitutional: Pt appears in NAD               HENT: Head: NCAT.  Right Ear: External ear normal.                 Left Ear: External ear normal.                Eyes: . Pupils are equal, round, and reactive to light. Conjunctivae and EOM are normal               Nose: without d/c or deformity               Neck: Neck supple. Gross normal ROM               Cardiovascular: Normal rate and regular rhythm.                 Pulmonary/Chest: Effort normal and breath sounds without rales or wheezing.                Abd:  Soft, NT, ND, + BS, no organomegaly with easily palpable aortic pulse though she is quite thin               Neurological: Pt is alert. At baseline orientation, motor grossly intact               Skin: Skin is warm. No rashes, no other new lesions, LE edema - none               Psychiatric: Pt behavior is normal without agitation   Micro: none  Cardiac tracings I have personally interpreted today:  none  Pertinent Radiological findings (summarize): none   Lab Results  Component Value Date   WBC  6.8 03/03/2024   HGB 13.0 03/03/2024   HCT 38.9 03/03/2024   PLT 226.0 03/03/2024   GLUCOSE 95 03/03/2024   CHOL 182 03/03/2024   TRIG 60.0 03/03/2024   HDL 90.30 03/03/2024   LDLCALC 80 03/03/2024   ALT 12 03/03/2024   AST 19 03/03/2024   NA 136 03/03/2024   K 3.9 03/03/2024   CL 101 03/03/2024   CREATININE 0.76 03/03/2024   BUN 10 03/03/2024   CO2 29 03/03/2024   TSH 2.77 03/03/2024   HGBA1C 5.9 03/03/2024   Assessment/Plan:  Danielle Williams is a 63 y.o. Black or African American [2] female with  has a past medical history of Anemia, Arthritis, History of hepatitis, Hypertension, Recurrent boils, and Vitamin D  deficiency.  Vitamin D  deficiency Last vitamin D  Lab Results  Component Value Date   VD25OH 28.61 (L) 03/03/2024   Low, to start oral replacement   Tobacco abuse Pt counlsed to quit, pt not ready  HLD (hyperlipidemia) Lab Results  Component Value Date   LDLCALC 80 03/03/2024   Stable, pt to continue low chol diet,   B12 deficiency Lab Results  Component Value Date   VITAMINB12 297 03/03/2024   Low, to start oral replacement - b12 1000 mcg every day x 6 mo   Pain of upper abdomen Etiology unclear, exam benign except for palpable aortic pulse of unclear significance; so with hx of smoking and HLD, will need aortic u/s r/o AAA  Followup: No follow-ups on file.  Lynwood Rush, MD 11/18/2024 12:46 PM [facility] [place] [facility] - [facility]

## 2024-11-18 NOTE — Assessment & Plan Note (Signed)
 Etiology unclear, exam benign except for palpable aortic pulse of unclear significance; so with hx of smoking and HLD, will need aortic u/s r/o AAA

## 2024-11-18 NOTE — Patient Instructions (Addendum)
 Please take OTC Vitamin D3 at 2000 units per day, indefinitely, and the OTC B12 supplement 1000 mcg per day  Please continue all other medications as before  Please have the pharmacy call with any other refills you may need.  Please continue your efforts at being more active, low cholesterol diet, and weight control.  Please keep your appointments with your specialists as you may have planned - Cardiology  You are given the note today  You will be contacted regarding the referral for: Aortic Ultrasound  We can hold on lab testing today  Please make an Appointment to return in 6 months, or sooner if needed, also with Lab Appointment for testing done 3-5 days before at the FIRST FLOOR Lab (so this is for TWO appointments - please see the scheduling desk as you leave)

## 2024-11-18 NOTE — Assessment & Plan Note (Signed)
 Lab Results  Component Value Date   VITAMINB12 297 03/03/2024   Low, to start oral replacement - b12 1000 mcg every day x 6 mo

## 2024-11-18 NOTE — Assessment & Plan Note (Signed)
 Last vitamin D  Lab Results  Component Value Date   VD25OH 28.61 (L) 03/03/2024   Low, to start oral replacement

## 2024-11-18 NOTE — Assessment & Plan Note (Signed)
 Pt counlsed to quit, pt not ready

## 2024-11-30 ENCOUNTER — Inpatient Hospital Stay: Admission: RE | Admit: 2024-11-30 | Discharge: 2024-11-30 | Attending: Internal Medicine | Admitting: Internal Medicine

## 2024-11-30 DIAGNOSIS — R1010 Upper abdominal pain, unspecified: Secondary | ICD-10-CM

## 2024-11-30 DIAGNOSIS — I10 Essential (primary) hypertension: Secondary | ICD-10-CM | POA: Diagnosis not present

## 2024-11-30 DIAGNOSIS — Z720 Tobacco use: Secondary | ICD-10-CM

## 2024-12-01 ENCOUNTER — Ambulatory Visit: Payer: Self-pay | Admitting: Internal Medicine
